# Patient Record
Sex: Male | Born: 1935 | Race: Black or African American | Hispanic: No | Marital: Married
Health system: Southern US, Community
[De-identification: ages and names within clinical notes are randomized; demographics above are authoritative.]

## PROBLEM LIST (undated history)

## (undated) DIAGNOSIS — J90 Pleural effusion, not elsewhere classified: Secondary | ICD-10-CM

## (undated) DIAGNOSIS — J9621 Acute and chronic respiratory failure with hypoxia: Secondary | ICD-10-CM

## (undated) DIAGNOSIS — J156 Pneumonia due to other aerobic Gram-negative bacteria: Secondary | ICD-10-CM

## (undated) DIAGNOSIS — J15212 Pneumonia due to Methicillin resistant Staphylococcus aureus: Secondary | ICD-10-CM

## (undated) DIAGNOSIS — I482 Chronic atrial fibrillation, unspecified: Secondary | ICD-10-CM

## (undated) DIAGNOSIS — I469 Cardiac arrest, cause unspecified: Secondary | ICD-10-CM

## (undated) DIAGNOSIS — D649 Anemia, unspecified: Secondary | ICD-10-CM

## (undated) HISTORY — PX: LARYNGECTOMY: SUR815

## (undated) HISTORY — PX: TRACHEOSTOMY: SUR1362

---

## 2018-04-06 ENCOUNTER — Inpatient Hospital Stay
Admission: AD | Admit: 2018-04-06 | Discharge: 2018-04-29 | Disposition: A | Discharge status: Skilled Nursing Facility | Payer: Medicare HMO | Source: Ambulatory Visit | Attending: Internal Medicine | Admitting: Internal Medicine

## 2018-04-06 DIAGNOSIS — J9621 Acute and chronic respiratory failure with hypoxia: Secondary | ICD-10-CM | POA: Diagnosis present

## 2018-04-06 DIAGNOSIS — I482 Chronic atrial fibrillation, unspecified: Secondary | ICD-10-CM | POA: Diagnosis present

## 2018-04-06 DIAGNOSIS — J15212 Pneumonia due to Methicillin resistant Staphylococcus aureus: Secondary | ICD-10-CM | POA: Diagnosis present

## 2018-04-06 DIAGNOSIS — J156 Pneumonia due to other aerobic Gram-negative bacteria: Secondary | ICD-10-CM | POA: Diagnosis present

## 2018-04-06 DIAGNOSIS — J90 Pleural effusion, not elsewhere classified: Secondary | ICD-10-CM | POA: Diagnosis present

## 2018-04-06 DIAGNOSIS — J9690 Respiratory failure, unspecified, unspecified whether with hypoxia or hypercapnia: Secondary | ICD-10-CM

## 2018-04-06 DIAGNOSIS — Z9889 Other specified postprocedural states: Secondary | ICD-10-CM

## 2018-04-06 DIAGNOSIS — J1569 Pneumonia due to other gram-negative bacteria: Secondary | ICD-10-CM | POA: Diagnosis present

## 2018-04-06 DIAGNOSIS — J189 Pneumonia, unspecified organism: Secondary | ICD-10-CM

## 2018-04-06 HISTORY — DX: Acute and chronic respiratory failure with hypoxia: J96.21

## 2018-04-06 HISTORY — DX: Cardiac arrest, cause unspecified: I46.9

## 2018-04-06 HISTORY — DX: Pneumonia due to methicillin resistant Staphylococcus aureus: J15.212

## 2018-04-06 HISTORY — DX: Anemia, unspecified: D64.9

## 2018-04-06 HISTORY — DX: Pleural effusion, not elsewhere classified: J90

## 2018-04-06 HISTORY — DX: Pneumonia due to other gram-negative bacteria: J15.6

## 2018-04-06 HISTORY — DX: Chronic atrial fibrillation, unspecified: I48.20

## 2018-04-06 LAB — COMPREHENSIVE METABOLIC PANEL
ALK PHOS: 216 U/L — AB (ref 38–126)
ALT: 33 U/L (ref 17–63)
AST: 36 U/L (ref 15–41)
Albumin: 2.4 g/dL — ABNORMAL LOW (ref 3.5–5.0)
Anion gap: 7 (ref 5–15)
BILIRUBIN TOTAL: 0.8 mg/dL (ref 0.3–1.2)
BUN: 41 mg/dL — AB (ref 6–20)
CALCIUM: 8.5 mg/dL — AB (ref 8.9–10.3)
CO2: 31 mmol/L (ref 22–32)
Chloride: 103 mmol/L (ref 101–111)
Creatinine, Ser: 0.85 mg/dL (ref 0.61–1.24)
GFR calc Af Amer: >60 mL/min (ref >60)
Glucose, Bld: 115 mg/dL — ABNORMAL HIGH (ref 65–99)
POTASSIUM: 4.4 mmol/L (ref 3.5–5.1)
Sodium: 141 mmol/L (ref 135–145)
TOTAL PROTEIN: 6.1 g/dL — AB (ref 6.5–8.1)

## 2018-04-06 LAB — CBC WITH DIFFERENTIAL/PLATELET
BAND NEUTROPHILS: 0 %
BLASTS: 0 %
Basophils Absolute: 0 10*3/uL (ref 0.0–0.1)
Basophils Relative: 0 %
EOS ABS: 0 10*3/uL (ref 0.0–0.7)
Eosinophils Relative: 0 %
HEMATOCRIT: 28.2 % — AB (ref 39.0–52.0)
Hemoglobin: 8.4 g/dL — ABNORMAL LOW (ref 13.0–17.0)
Lymphocytes Relative: 7 %
Lymphs Abs: 0.4 10*3/uL — ABNORMAL LOW (ref 0.7–4.0)
MCH: 26.3 pg (ref 26.0–34.0)
MCHC: 29.8 g/dL — ABNORMAL LOW (ref 30.0–36.0)
MCV: 88.1 fL (ref 78.0–100.0)
METAMYELOCYTES PCT: 0 %
MYELOCYTES: 0 %
Monocytes Absolute: 0.5 10*3/uL (ref 0.1–1.0)
Monocytes Relative: 8 %
NEUTROS ABS: 5.4 10*3/uL (ref 1.7–7.7)
Neutrophils Relative %: 85 %
Other: 0 %
PROMYELOCYTES RELATIVE: 0 %
Platelets: 383 10*3/uL (ref 150–400)
RBC: 3.2 MIL/uL — AB (ref 4.22–5.81)
RDW: 21.1 % — ABNORMAL HIGH (ref 11.5–15.5)
WBC: 6.3 10*3/uL (ref 4.0–10.5)
nRBC: 0 /100 WBC

## 2018-04-06 LAB — PROTIME-INR
INR: 1.12
PROTHROMBIN TIME: 14.3 s (ref 11.4–15.2)

## 2018-04-06 LAB — VANCOMYCIN, TROUGH: Vancomycin Tr: 24 ug/mL (ref 15–20)

## 2018-04-07 ENCOUNTER — Encounter: Payer: Self-pay | Admitting: Internal Medicine

## 2018-04-07 ENCOUNTER — Other Ambulatory Visit (HOSPITAL_COMMUNITY): Payer: Medicare HMO

## 2018-04-07 DIAGNOSIS — J15212 Pneumonia due to Methicillin resistant Staphylococcus aureus: Secondary | ICD-10-CM | POA: Diagnosis present

## 2018-04-07 DIAGNOSIS — J156 Pneumonia due to other aerobic Gram-negative bacteria: Secondary | ICD-10-CM | POA: Diagnosis not present

## 2018-04-07 DIAGNOSIS — J90 Pleural effusion, not elsewhere classified: Secondary | ICD-10-CM | POA: Diagnosis present

## 2018-04-07 DIAGNOSIS — Z9889 Other specified postprocedural states: Secondary | ICD-10-CM | POA: Diagnosis not present

## 2018-04-07 DIAGNOSIS — I469 Cardiac arrest, cause unspecified: Secondary | ICD-10-CM | POA: Diagnosis not present

## 2018-04-07 DIAGNOSIS — J9621 Acute and chronic respiratory failure with hypoxia: Secondary | ICD-10-CM | POA: Diagnosis present

## 2018-04-07 DIAGNOSIS — I482 Chronic atrial fibrillation, unspecified: Secondary | ICD-10-CM | POA: Diagnosis present

## 2018-04-07 DIAGNOSIS — J1569 Pneumonia due to other gram-negative bacteria: Secondary | ICD-10-CM | POA: Diagnosis present

## 2018-04-07 LAB — CBC WITH DIFFERENTIAL/PLATELET
BASOS ABS: 0 10*3/uL (ref 0.0–0.1)
BASOS PCT: 0 %
Eosinophils Absolute: 0 10*3/uL (ref 0.0–0.7)
Eosinophils Relative: 0 %
HCT: 27.4 % — ABNORMAL LOW (ref 39.0–52.0)
HEMOGLOBIN: 8.2 g/dL — AB (ref 13.0–17.0)
LYMPHS ABS: 0.4 10*3/uL — AB (ref 0.7–4.0)
Lymphocytes Relative: 6 %
MCH: 26.5 pg (ref 26.0–34.0)
MCHC: 29.9 g/dL — AB (ref 30.0–36.0)
MCV: 88.7 fL (ref 78.0–100.0)
MONOS PCT: 5 %
Monocytes Absolute: 0.3 10*3/uL (ref 0.1–1.0)
NEUTROS ABS: 5.7 10*3/uL (ref 1.7–7.7)
Neutrophils Relative %: 89 %
Platelets: 363 10*3/uL (ref 150–400)
RBC: 3.09 MIL/uL — ABNORMAL LOW (ref 4.22–5.81)
RDW: 21.2 % — ABNORMAL HIGH (ref 11.5–15.5)
WBC: 6.4 10*3/uL (ref 4.0–10.5)

## 2018-04-07 LAB — COMPREHENSIVE METABOLIC PANEL
ALK PHOS: 206 U/L — AB (ref 38–126)
ALT: 32 U/L (ref 17–63)
AST: 30 U/L (ref 15–41)
Albumin: 2.4 g/dL — ABNORMAL LOW (ref 3.5–5.0)
Anion gap: 7 (ref 5–15)
BUN: 38 mg/dL — AB (ref 6–20)
CALCIUM: 8.5 mg/dL — AB (ref 8.9–10.3)
CO2: 33 mmol/L — AB (ref 22–32)
Chloride: 102 mmol/L (ref 101–111)
Creatinine, Ser: 0.9 mg/dL (ref 0.61–1.24)
GFR calc Af Amer: >60 mL/min (ref >60)
GFR calc non Af Amer: >60 mL/min (ref >60)
GLUCOSE: 111 mg/dL — AB (ref 65–99)
Potassium: 4.8 mmol/L (ref 3.5–5.1)
Sodium: 142 mmol/L (ref 135–145)
TOTAL PROTEIN: 6.2 g/dL — AB (ref 6.5–8.1)
Total Bilirubin: 0.8 mg/dL (ref 0.3–1.2)

## 2018-04-07 LAB — PROTIME-INR
INR: 1.17
PROTHROMBIN TIME: 14.8 s (ref 11.4–15.2)

## 2018-04-07 MED ORDER — SODIUM CHLORIDE 0.9 % IV SOLN
10.00 | INTRAVENOUS | Status: DC
Start: ? — End: 2018-04-07

## 2018-04-07 MED ORDER — FUROSEMIDE 10 MG/ML IJ SOLN
40.00 | INTRAMUSCULAR | Status: DC
Start: 2018-04-06 — End: 2018-04-07

## 2018-04-07 MED ORDER — GENERIC EXTERNAL MEDICATION
1.75 | Status: DC
Start: 2018-04-06 — End: 2018-04-07

## 2018-04-07 MED ORDER — MAGNESIUM HYDROXIDE 400 MG/5ML PO SUSP
30.00 | ORAL | Status: DC
Start: ? — End: 2018-04-07

## 2018-04-07 MED ORDER — GENERIC EXTERNAL MEDICATION
Status: DC
Start: ? — End: 2018-04-07

## 2018-04-07 MED ORDER — ACETAMINOPHEN 325 MG PO TABS
650.00 | ORAL_TABLET | ORAL | Status: DC
Start: ? — End: 2018-04-07

## 2018-04-07 MED ORDER — FENTANYL CITRATE (PF) 100 MCG/2ML IJ SOLN
25.00 | INTRAMUSCULAR | Status: DC
Start: ? — End: 2018-04-07

## 2018-04-07 MED ORDER — FIRST-LANSOPRAZOLE 3 MG/ML PO SUSP
30.00 | ORAL | Status: DC
Start: 2018-04-07 — End: 2018-04-07

## 2018-04-07 MED ORDER — GENERIC EXTERNAL MEDICATION
1.00 | Status: DC
Start: 2018-04-06 — End: 2018-04-07

## 2018-04-07 MED ORDER — POLYVINYL ALCOHOL 1.4 % OP SOLN
1.00 | OPHTHALMIC | Status: DC
Start: ? — End: 2018-04-07

## 2018-04-07 MED ORDER — ALBUTEROL SULFATE (2.5 MG/3ML) 0.083% IN NEBU
2.50 | INHALATION_SOLUTION | RESPIRATORY_TRACT | Status: DC
Start: ? — End: 2018-04-07

## 2018-04-07 MED ORDER — GENERIC EXTERNAL MEDICATION
5.00 | Status: DC
Start: 2018-04-07 — End: 2018-04-07

## 2018-04-07 MED ORDER — ALUM & MAG HYDROXIDE-SIMETH 200-200-20 MG/5ML PO SUSP
30.00 | ORAL | Status: DC
Start: ? — End: 2018-04-07

## 2018-04-07 MED ORDER — METHYLPREDNISOLONE SODIUM SUCC 40 MG IJ SOLR
20.00 | INTRAMUSCULAR | Status: DC
Start: 2018-04-06 — End: 2018-04-07

## 2018-04-07 MED ORDER — SENNA-DOCUSATE SODIUM 8.6-50 MG PO TABS
1.00 | ORAL_TABLET | ORAL | Status: DC
Start: ? — End: 2018-04-07

## 2018-04-07 MED ORDER — HEPARIN SODIUM (PORCINE) 5000 UNIT/ML IJ SOLN
5000.00 | INTRAMUSCULAR | Status: DC
Start: 2018-04-06 — End: 2018-04-07

## 2018-04-07 MED ORDER — AMLODIPINE BESYLATE 10 MG PO TABS
10.00 | ORAL_TABLET | ORAL | Status: DC
Start: 2018-04-07 — End: 2018-04-07

## 2018-04-07 MED ORDER — LISINOPRIL 10 MG PO TABS
10.00 | ORAL_TABLET | ORAL | Status: DC
Start: 2018-04-07 — End: 2018-04-07

## 2018-04-07 MED ORDER — ONDANSETRON HCL 4 MG PO TABS
4.00 | ORAL_TABLET | ORAL | Status: DC
Start: ? — End: 2018-04-07

## 2018-04-07 MED ORDER — LORAZEPAM 2 MG/ML IJ SOLN
.50 | INTRAMUSCULAR | Status: DC
Start: ? — End: 2018-04-07

## 2018-04-07 NOTE — Consult Note (Signed)
Pulmonary Critical Care Medicine [facility] [place]  PULMONARY SERVICE  Date of Service: 04/07/2018  PULMONARY CONSULT   Chris [name]  VWU:[id number]  DOB: 02/07/1929   DOA: 04/06/2018  Referring Physician: Carron Curie, MD  HPI: Chris [name] is a 84 y.o. male seen for follow up of Acute on Chronic Respiratory Failure.  Patient has multiple medical problems was admitted with a history of laryngeal cancer status post laryngectomy and a chronic tracheostomy.  Patient had recurrent GI bleeds chronic diastolic heart failure pulmonary hypertension.  Presented to the ED with increased secretions from his trach and also swelling around the trach.  Patient was diagnosed at the time with congestive heart failure and pneumonia.  Patient was started on antibiotics and also was started on diuretics.  Apparently had a CODE BLUE was found to have V. fib arrest and was defibrillated x2.  Patient underwent resuscitation for about 10 minutes.  He was admitted to the ICU was started on hypothermia protocol.  Eventually after stabilization the patient was placed on T collar trials and also had a palliative care consultation.  Complications include on day 9 of MRSA pneumonia and gram-negative pneumonia.  Patient was treated with multiple rounds of antibiotics.  For his heart failure patient was diuresed aggressively.  And for his pneumonia he was started on antibiotics.  Review of Systems:  ROS performed and is unremarkable other than noted above.  Past Medical History:  Diagnosis Date  . Acute on chronic respiratory failure with hypoxia (HCC)   . Cardiac arrest (HCC)   . Chronic anemia   . Chronic atrial fibrillation (HCC)   . Gram-negative pneumonia (HCC)   . MRSA pneumonia (HCC)   . Pleural effusion     Past Surgical History:  Procedure Laterality Date  . LARYNGECTOMY    . TRACHEOSTOMY      Social History:    has an unknown smoking status. He has never used smokeless tobacco. He reports  that he drank alcohol. He reports that he has current or past drug history.  Family History: Non-Contributory to the present illness  Allergies not on file  Medications: Reviewed on Rounds  Physical Exam:  Vitals: Temperature 97.6 pulse 54 respiratory rate 17 blood pressure 148/68 saturations 95%  Ventilator Settings currently on T collar FiO2 40%  . General: Comfortable at this time . Eyes: Grossly normal lids, irises & conjunctiva . ENT: grossly tongue is normal . Neck: no obvious mass . Cardiovascular: S1-S2 normal no gallop or rub . Respiratory: No rhonchi expansion equal . Abdomen: Obese and soft . Skin: no rash seen on limited exam . Musculoskeletal: not rigid . Psychiatric:unable to assess . Neurologic: no seizure no involuntary movements         Labs on Admission:  Basic Metabolic Panel: Recent Labs  Lab 04/06/18 1708 04/07/18 0516  NA 141 142  K 4.4 4.8  CL 103 102  CO2 31 33*  GLUCOSE 115* 111*  BUN 41* 38*  CREATININE 0.85 0.90  CALCIUM 8.5* 8.5*    Liver Function Tests: Recent Labs  Lab 04/06/18 1708 04/07/18 0516  AST 36 30  ALT 33 32  ALKPHOS 216* 206*  BILITOT 0.8 0.8  PROT 6.1* 6.2*  ALBUMIN 2.4* 2.4*   No results for input(s): LIPASE, AMYLASE in the last 168 hours. No results for input(s): AMMONIA in the last 168 hours.  CBC: Recent Labs  Lab 04/06/18 1708 04/07/18 0516  WBC 6.3 6.4  NEUTROABS 5.4 5.7  HGB 8.4* 8.2*  HCT 28.2* 27.4*  MCV 88.1 88.7  PLT 383 363    Cardiac Enzymes: No results for input(s): CKTOTAL, CKMB, CKMBINDEX, TROPONINI in the last 168 hours.  BNP (last 3 results) No results for input(s): BNP in the last 8760 hours.  ProBNP (last 3 results) No results for input(s): PROBNP in the last 8760 hours.   Radiological Exams on Admission: Dg Chest Port 1 View  Result Date: 04/07/2018 CLINICAL DATA:  Respiratory failure, pneumonia. EXAM: PORTABLE CHEST 1 VIEW COMPARISON:  None. FINDINGS: Tracheostomy tube  is in grossly good position. Complete opacification of the left hemithorax is noted most consistent with pleural effusion, pneumonia, atelectasis or combination thereof. Mild right basilar atelectasis or edema is noted with associated pleural effusion. Bony thorax is unremarkable. IMPRESSION: Complete opacification of left hemithorax is noted consistent with pleural effusion, pneumonia, atelectasis or combination thereof. Mild right basilar atelectasis or edema is noted with associated pleural effusion. Electronically Signed   By: Lupita Raider, M.D.   On: 04/07/2018 09:36    Assessment/Plan Active Problems:   Acute on chronic respiratory failure with hypoxia (HCC)   MRSA pneumonia (HCC)   Gram-negative pneumonia (HCC)   Pleural effusion   Chronic atrial fibrillation (HCC)   1. Acute on chronic respiratory failure with hypoxia.  Patient right now is on T collar this is his baseline.  He is adamant T collar for review years her goal should be to wean the FiO2 down as tolerated. 2. MRSA pneumonia treated with antibiotics we will continue with supportive care. 3. Gram-negative pneumonia also treated with 4. Chronic atrial fibrillation rate is controlled we will continue to follow. 5. Pleural effusion follow the x-rays as necessary. 6. Cardiac arrest right now is stable  I have personally seen and evaluated the patient, evaluated laboratory and imaging results, formulated the assessment and plan and placed orders. The Patient requires high complexity decision making for assessment and support.  Case was discussed on Rounds with the Respiratory Therapy Staff Time Spent  Yevonne Pax, MD [facility] Pulmonary Critical Care Medicine Sleep Medicine

## 2018-04-08 DIAGNOSIS — J15212 Pneumonia due to Methicillin resistant Staphylococcus aureus: Secondary | ICD-10-CM | POA: Diagnosis not present

## 2018-04-08 DIAGNOSIS — I482 Chronic atrial fibrillation: Secondary | ICD-10-CM | POA: Diagnosis not present

## 2018-04-08 DIAGNOSIS — J9621 Acute and chronic respiratory failure with hypoxia: Secondary | ICD-10-CM | POA: Diagnosis not present

## 2018-04-08 DIAGNOSIS — J156 Pneumonia due to other aerobic Gram-negative bacteria: Secondary | ICD-10-CM | POA: Diagnosis not present

## 2018-04-08 NOTE — Progress Notes (Signed)
Pulmonary Critical Care Medicine [facility] [place]   PULMONARY SERVICE  PROGRESS NOTE  Date of Service: 04/08/2018  Chris [name]  [id number]:[id number]  DOB: [birth year]   DOA: 04/06/2018  Referring Physician: Carron Curie, MD  HPI: Chris [name] is a 84 y.o. male seen for follow up of Acute on Chronic Respiratory Failure.  Patient is on T collar 28% FiO2 base and basically is back at baseline with a chronic tracheostomy in place.  Medications: Reviewed on Rounds  Physical Exam:  Vitals: Temperature 96.5 pulse 57 respiratory rate 15 blood pressure 176/77 saturations 93%  Ventilator Settings currently is on T collar will be continued  . General: Comfortable at this time . Eyes: Grossly normal lids, irises & conjunctiva . ENT: grossly tongue is normal . Neck: no obvious mass . Cardiovascular: S1 S2 normal no gallop . Respiratory: No rhonchi expansion is equal . Abdomen: soft . Skin: no rash seen on limited exam . Musculoskeletal: not rigid . Psychiatric:unable to assess . Neurologic: no seizure no involuntary movements         Labs on Admission:  Basic Metabolic Panel: Recent Labs  Lab 04/06/18 1708 04/07/18 0516  NA 141 142  K 4.4 4.8  CL 103 102  CO2 31 33*  GLUCOSE 115* 111*  BUN 41* 38*  CREATININE 0.85 0.90  CALCIUM 8.5* 8.5*    Liver Function Tests: Recent Labs  Lab 04/06/18 1708 04/07/18 0516  AST 36 30  ALT 33 32  ALKPHOS 216* 206*  BILITOT 0.8 0.8  PROT 6.1* 6.2*  ALBUMIN 2.4* 2.4*   No results for input(s): LIPASE, AMYLASE in the last 168 hours. No results for input(s): AMMONIA in the last 168 hours.  CBC: Recent Labs  Lab 04/06/18 1708 04/07/18 0516  WBC 6.3 6.4  NEUTROABS 5.4 5.7  HGB 8.4* 8.2*  HCT 28.2* 27.4*  MCV 88.1 88.7  PLT 383 363    Cardiac Enzymes: No results for input(s): CKTOTAL, CKMB, CKMBINDEX, TROPONINI in the last 168 hours.  BNP (last 3 results) No results for input(s): BNP in the last 8760  hours.  ProBNP (last 3 results) No results for input(s): PROBNP in the last 8760 hours.  Radiological Exams on Admission: Dg Chest Port 1 View  Result Date: 04/07/2018 CLINICAL DATA:  Respiratory failure, pneumonia. EXAM: PORTABLE CHEST 1 VIEW COMPARISON:  None. FINDINGS: Tracheostomy tube is in grossly good position. Complete opacification of the left hemithorax is noted most consistent with pleural effusion, pneumonia, atelectasis or combination thereof. Mild right basilar atelectasis or edema is noted with associated pleural effusion. Bony thorax is unremarkable. IMPRESSION: Complete opacification of left hemithorax is noted consistent with pleural effusion, pneumonia, atelectasis or combination thereof. Mild right basilar atelectasis or edema is noted with associated pleural effusion. Electronically Signed   By: Lupita Raider, M.D.   On: 04/07/2018 09:36    Assessment/Plan Principal Problem:   Acute on chronic respiratory failure with hypoxia (HCC) Active Problems:   MRSA pneumonia (HCC)   Gram-negative pneumonia (HCC)   Pleural effusion   Chronic atrial fibrillation (HCC)   1. Acute on chronic respiratory failure with hypoxia patient right now is on T collar is actually doing okay has been on 28% oxygen.  The chest x-ray however shows almost complete opacification of the left hemithorax which may represent atelectasis versus fluid.  I suggest we get a CT of the chest. 2. Pleural effusion we will do a CT of the chest to really observe the  amount of fluid that is present on the left side.  He may need a bronchoscopy versus thoracentesis. 3. Gram-negative pneumonia has been treated with antibiotics we will continue to follow 4. Chronic atrial fibrillation rate is controlled 5. MRSA pneumonia has been treated with antibiotics.   I have personally seen and evaluated the patient, evaluated laboratory and imaging results, formulated the assessment and plan and placed orders. The Patient  requires high complexity decision making for assessment and support.  Case was discussed on Rounds with the Respiratory Therapy Staff time spent 35 minutes  Yevonne Pax, MD [facility] Pulmonary Critical Care Medicine Sleep Medicine

## 2018-04-09 ENCOUNTER — Institutional Professional Consult (permissible substitution) (HOSPITAL_COMMUNITY): Payer: Medicare HMO

## 2018-04-09 ENCOUNTER — Encounter: Payer: Self-pay | Admitting: *Deleted

## 2018-04-09 LAB — VANCOMYCIN, TROUGH: Vancomycin Tr: 28 ug/mL (ref 15–20)

## 2018-04-09 MED ORDER — IOHEXOL 300 MG/ML  SOLN
100.0000 mL | Freq: Once | INTRAMUSCULAR | Status: AC | PRN
  Administered 2018-04-09: 75 mL via INTRAVENOUS

## 2018-04-09 MED ORDER — GENERIC EXTERNAL MEDICATION
Status: DC
Start: ? — End: 2018-04-09

## 2018-04-10 LAB — VANCOMYCIN, TROUGH: VANCOMYCIN TR: 19 ug/mL (ref 15–20)

## 2018-04-11 DIAGNOSIS — J15212 Pneumonia due to Methicillin resistant Staphylococcus aureus: Secondary | ICD-10-CM | POA: Diagnosis not present

## 2018-04-11 DIAGNOSIS — J156 Pneumonia due to other aerobic Gram-negative bacteria: Secondary | ICD-10-CM | POA: Diagnosis not present

## 2018-04-11 DIAGNOSIS — J9621 Acute and chronic respiratory failure with hypoxia: Secondary | ICD-10-CM | POA: Diagnosis not present

## 2018-04-11 DIAGNOSIS — I482 Chronic atrial fibrillation: Secondary | ICD-10-CM | POA: Diagnosis not present

## 2018-04-11 LAB — CBC
HCT: 26.3 % — ABNORMAL LOW (ref 39.0–52.0)
HEMOGLOBIN: 7.9 g/dL — AB (ref 13.0–17.0)
MCH: 26.8 pg (ref 26.0–34.0)
MCHC: 30 g/dL (ref 30.0–36.0)
MCV: 89.2 fL (ref 78.0–100.0)
Platelets: 260 10*3/uL (ref 150–400)
RBC: 2.95 MIL/uL — AB (ref 4.22–5.81)
RDW: 20.8 % — AB (ref 11.5–15.5)
WBC: 6 10*3/uL (ref 4.0–10.5)

## 2018-04-11 LAB — BASIC METABOLIC PANEL
Anion gap: 6 (ref 5–15)
BUN: 30 mg/dL — ABNORMAL HIGH (ref 6–20)
CHLORIDE: 103 mmol/L (ref 101–111)
CO2: 33 mmol/L — ABNORMAL HIGH (ref 22–32)
CREATININE: 0.74 mg/dL (ref 0.61–1.24)
Calcium: 8.6 mg/dL — ABNORMAL LOW (ref 8.9–10.3)
GFR calc Af Amer: >60 mL/min (ref >60)
GFR calc non Af Amer: >60 mL/min (ref >60)
GLUCOSE: 87 mg/dL (ref 65–99)
Potassium: 3.7 mmol/L (ref 3.5–5.1)
Sodium: 142 mmol/L (ref 135–145)

## 2018-04-11 NOTE — Progress Notes (Signed)
Pulmonary Critical Care Medicine [facility] [place]   PULMONARY SERVICE  PROGRESS NOTE  Date of Service: 04/11/2018  Chris [name]  ZOX:[id number]  DOB: 22-Aug-1929   DOA: 04/06/2018  Referring Physician: Carron Curie, MD  HPI: Chris [name] is a 84 y.o. male seen for follow up of Acute on Chronic Respiratory Failure.  Doing well at baseline on her/T collar patient is on 20% oxygen good saturations are noted.  Medications: Reviewed on Rounds  Physical Exam:  Vitals: Temperature 98.4 pulse 82 respiratory rate 23 blood pressure 168/80 saturations 98%  Ventilator Settings off the ventilator on T collar FiO2 28%  . General: Comfortable at this time . Eyes: Grossly normal lids, irises & conjunctiva . ENT: grossly tongue is normal . Neck: no obvious mass . Cardiovascular: S1 S2 normal no gallop . Respiratory: No rhonchi noted . Abdomen: soft . Skin: no rash seen on limited exam . Musculoskeletal: not rigid . Psychiatric:unable to assess . Neurologic: no seizure no involuntary movements         Labs on Admission:  Basic Metabolic Panel: Recent Labs  Lab 04/06/18 1708 04/07/18 0516 04/11/18 0439  NA 141 142 142  K 4.4 4.8 3.7  CL 103 102 103  CO2 31 33* 33*  GLUCOSE 115* 111* 87  BUN 41* 38* 30*  CREATININE 0.85 0.90 0.74  CALCIUM 8.5* 8.5* 8.6*    Liver Function Tests: Recent Labs  Lab 04/06/18 1708 04/07/18 0516  AST 36 30  ALT 33 32  ALKPHOS 216* 206*  BILITOT 0.8 0.8  PROT 6.1* 6.2*  ALBUMIN 2.4* 2.4*   No results for input(s): LIPASE, AMYLASE in the last 168 hours. No results for input(s): AMMONIA in the last 168 hours.  CBC: Recent Labs  Lab 04/06/18 1708 04/07/18 0516 04/11/18 0439  WBC 6.3 6.4 6.0  NEUTROABS 5.4 5.7  --   HGB 8.4* 8.2* 7.9*  HCT 28.2* 27.4* 26.3*  MCV 88.1 88.7 89.2  PLT 383 363 260    Cardiac Enzymes: No results for input(s): CKTOTAL, CKMB, CKMBINDEX, TROPONINI in the last 168 hours.  BNP (last 3  results) No results for input(s): BNP in the last 8760 hours.  ProBNP (last 3 results) No results for input(s): PROBNP in the last 8760 hours.  Radiological Exams on Admission: Ct Chest W Contrast  Result Date: 04/09/2018 CLINICAL DATA:  Respiratory failure, shortness of breath EXAM: CT CHEST WITH CONTRAST TECHNIQUE: Multidetector CT imaging of the chest was performed during intravenous contrast administration. CONTRAST:  75mL OMNIPAQUE IOHEXOL 300 MG/ML  SOLN COMPARISON:  Radiography 04/07/2018 FINDINGS: Cardiovascular: Four-chamber cardiac enlargement. Small pericardial effusion. Dilated central pulmonary arteries. Coronary calcifications. Aortic leaflet calcifications. Dilated aortic root up to 5.1 cm diameter. Scattered calcified atheromatous plaque through the thoracic aorta. Mediastinum/Nodes: Tracheostomy projects in expected location. No hilar or mediastinal adenopathy. Lungs/Pleura: Moderate bilateral pleural effusions. Associated dependent atelectasis in the right lower lobe. More extensive consolidation throughout much of the left lung with relative sparing of the apex. Upper Abdomen: No acute findings Musculoskeletal: Anterior vertebral endplate spurring at multiple levels in the mid and lower thoracic spine. Healing sternal fracture. No acute fracture or worrisome bone lesion. IMPRESSION: 1. Extensive airspace consolidation throughout the left lung. 2. Moderate bilateral pleural effusions. 3. Dilated central pulmonary arteries suggesting pulmonary arterial hypertension. 4. Coronary and aortic leaflet calcifications. Correlate with any echocardiographic evidence of aortic stenosis. 5. 4.1 cm ascending thoracic aortic aneurysm. Recommend semi-annual imaging followup by CTA or MRA and referral  to cardiothoracic surgery if not already obtained. This recommendation follows 2010 ACCF/AHA/AATS/ACR/ASA/SCA/SCAI/SIR/STS/SVM Guidelines for the Diagnosis and Management of Patients With Thoracic Aortic  Disease. Circulation. 2010; 121: J811-B147 Electronically Signed   By: Corlis Leak M.D.   On: 04/09/2018 13:35    Assessment/Plan Principal Problem:   Acute on chronic respiratory failure with hypoxia (HCC) Active Problems:   MRSA pneumonia (HCC)   Gram-negative pneumonia (HCC)   Pleural effusion   Chronic atrial fibrillation (HCC)   1. Acute on chronic respiratory failure with hypoxia we will continue with T collar trials continue pulmonary toilet supportive care. 2. MRSA pneumonia treated continue to monitor.  Last CT scan still showed extensive airway disease.  We will continue to monitor 3. Gram-negative pneumonia treated with antibiotics continue with supportive care 4. Pleural effusion moderate-sized on the CT 5. Chronic atrial fibrillation rate is controlled at this time.   I have personally seen and evaluated the patient, evaluated laboratory and imaging results, formulated the assessment and plan and placed orders. The Patient requires high complexity decision making for assessment and support.  Case was discussed on Rounds with the Respiratory Therapy Staff  Yevonne Pax, MD [facility] Pulmonary Critical Care Medicine Sleep Medicine

## 2018-04-12 DIAGNOSIS — J9621 Acute and chronic respiratory failure with hypoxia: Secondary | ICD-10-CM | POA: Diagnosis not present

## 2018-04-12 DIAGNOSIS — J156 Pneumonia due to other aerobic Gram-negative bacteria: Secondary | ICD-10-CM | POA: Diagnosis not present

## 2018-04-12 DIAGNOSIS — J15212 Pneumonia due to Methicillin resistant Staphylococcus aureus: Secondary | ICD-10-CM | POA: Diagnosis not present

## 2018-04-12 DIAGNOSIS — I482 Chronic atrial fibrillation: Secondary | ICD-10-CM | POA: Diagnosis not present

## 2018-04-12 NOTE — Progress Notes (Signed)
Pulmonary Critical Care Medicine [facility] [place]   PULMONARY SERVICE  PROGRESS NOTE  Date of Service: 04/12/2018  TRESON [name]  WUJ:[id number]  DOB: 1929-03-05   DOA: 04/06/2018  Referring Physician: Carron Curie, MD  HPI: Chris [name] is a 84 y.o. male seen for follow up of Acute on Chronic Respiratory Failure.  He is on the T collar looks good right now.  Comfortable without distress.  Patient's been on 28% oxygen  Medications: Reviewed on Rounds  Physical Exam:  Vitals: Temperature 97.2 pulse 53 respiratory rate 11 blood pressure 155/69 saturations 98%  Ventilator Settings off the ventilator on T collar at this time.  . General: Comfortable at this time . Eyes: Grossly normal lids, irises & conjunctiva . ENT: grossly tongue is normal . Neck: no obvious mass . Cardiovascular: S1 S2 normal no gallop . Respiratory: No rhonchi or rales expansion is equal . Abdomen: soft . Skin: no rash seen on limited exam . Musculoskeletal: not rigid . Psychiatric:unable to assess . Neurologic: no seizure no involuntary movements         Labs on Admission:  Basic Metabolic Panel: Recent Labs  Lab 04/06/18 1708 04/07/18 0516 04/11/18 0439  NA 141 142 142  K 4.4 4.8 3.7  CL 103 102 103  CO2 31 33* 33*  GLUCOSE 115* 111* 87  BUN 41* 38* 30*  CREATININE 0.85 0.90 0.74  CALCIUM 8.5* 8.5* 8.6*    Liver Function Tests: Recent Labs  Lab 04/06/18 1708 04/07/18 0516  AST 36 30  ALT 33 32  ALKPHOS 216* 206*  BILITOT 0.8 0.8  PROT 6.1* 6.2*  ALBUMIN 2.4* 2.4*   No results for input(s): LIPASE, AMYLASE in the last 168 hours. No results for input(s): AMMONIA in the last 168 hours.  CBC: Recent Labs  Lab 04/06/18 1708 04/07/18 0516 04/11/18 0439  WBC 6.3 6.4 6.0  NEUTROABS 5.4 5.7  --   HGB 8.4* 8.2* 7.9*  HCT 28.2* 27.4* 26.3*  MCV 88.1 88.7 89.2  PLT 383 363 260    Cardiac Enzymes: No results for input(s): CKTOTAL, CKMB, CKMBINDEX, TROPONINI in the  last 168 hours.  BNP (last 3 results) No results for input(s): BNP in the last 8760 hours.  ProBNP (last 3 results) No results for input(s): PROBNP in the last 8760 hours.  Radiological Exams on Admission: Ct Chest W Contrast  Result Date: 04/09/2018 CLINICAL DATA:  Respiratory failure, shortness of breath EXAM: CT CHEST WITH CONTRAST TECHNIQUE: Multidetector CT imaging of the chest was performed during intravenous contrast administration. CONTRAST:  75mL OMNIPAQUE IOHEXOL 300 MG/ML  SOLN COMPARISON:  Radiography 04/07/2018 FINDINGS: Cardiovascular: Four-chamber cardiac enlargement. Small pericardial effusion. Dilated central pulmonary arteries. Coronary calcifications. Aortic leaflet calcifications. Dilated aortic root up to 5.1 cm diameter. Scattered calcified atheromatous plaque through the thoracic aorta. Mediastinum/Nodes: Tracheostomy projects in expected location. No hilar or mediastinal adenopathy. Lungs/Pleura: Moderate bilateral pleural effusions. Associated dependent atelectasis in the right lower lobe. More extensive consolidation throughout much of the left lung with relative sparing of the apex. Upper Abdomen: No acute findings Musculoskeletal: Anterior vertebral endplate spurring at multiple levels in the mid and lower thoracic spine. Healing sternal fracture. No acute fracture or worrisome bone lesion. IMPRESSION: 1. Extensive airspace consolidation throughout the left lung. 2. Moderate bilateral pleural effusions. 3. Dilated central pulmonary arteries suggesting pulmonary arterial hypertension. 4. Coronary and aortic leaflet calcifications. Correlate with any echocardiographic evidence of aortic stenosis. 5. 4.1 cm ascending thoracic aortic aneurysm. Recommend  semi-annual imaging followup by CTA or MRA and referral to cardiothoracic surgery if not already obtained. This recommendation follows 2010 ACCF/AHA/AATS/ACR/ASA/SCA/SCAI/SIR/STS/SVM Guidelines for the Diagnosis and Management of  Patients With Thoracic Aortic Disease. Circulation. 2010; 121: [id number] Electronically Signed   By: Corlis Leak M.D.   On: 04/09/2018 13:35    Assessment/Plan Principal Problem:   Acute on chronic respiratory failure with hypoxia (HCC) Active Problems:   MRSA pneumonia (HCC)   Gram-negative pneumonia (HCC)   Pleural effusion   Chronic atrial fibrillation (HCC)   1. Acute on chronic respiratory failure with hypoxia we will continue with full supportive care on T collar patient is at baseline right now.  We will continue pulmonary toilet supportive care 2. MRSA pneumonia treated with antibiotics we will continue present management 3. Chronic atrial fibrillation rate is controlled 4. Pleural effusion stable follow-up x-ray as needed   I have personally seen and evaluated the patient, evaluated laboratory and imaging results, formulated the assessment and plan and placed orders. The Patient requires high complexity decision making for assessment and support.  Case was discussed on Rounds with the Respiratory Therapy Staff  Yevonne Pax, MD [facility] Pulmonary Critical Care Medicine Sleep Medicine

## 2018-04-12 NOTE — Consult Note (Signed)
Referring Physician:  THURMON Chris [name] is an 84 y.o. male.                       Chief Complaint: Bradycardia  HPI: 84 year old male with PMH of acute on chronic respiratory failure, laryngeal cancer, s/p laryngectomy and chronic tracheostomy, GI bleed, Diastolic heart failure, pulmonary systolic hypertension, s/p V. Fib arrest, pneumonia has atrial fibrillation with high grade AV block at times. He is not on B-blocker therapy or lanoxin. He is on amlodipine and lisinopril for blood pressure control.   Past Medical History:  Diagnosis Date  . Acute on chronic respiratory failure with hypoxia ([place])   . Cardiac arrest ([place])   . Chronic anemia   . Chronic atrial fibrillation ([place])   . Gram-negative pneumonia ([place])   . MRSA pneumonia ([place])   . Pleural effusion       Past Surgical History:  Procedure Laterality Date  . LARYNGECTOMY    . TRACHEOSTOMY      Family History  Family history unknown: Yes   Social History:  has an unknown smoking status. He has never used smokeless tobacco. He reports that he drank alcohol. He reports that he has current or past drug history.  Allergies: Not on File  No medications prior to admission.    Results for orders placed or performed during the hospital encounter of 04/06/18 (from the past 48 hour(s))  CBC     Status: Abnormal   Collection Time: 04/11/18  4:39 AM  Result Value Ref Range   WBC 6.0 4.0 - 10.5 K/uL   RBC 2.95 (L) 4.22 - 5.81 MIL/uL   Hemoglobin 7.9 (L) 13.0 - 17.0 g/dL   HCT 26.3 (L) 39.0 - 52.0 %   MCV 89.2 78.0 - 100.0 fL   MCH 26.8 26.0 - 34.0 pg   MCHC 30.0 30.0 - 36.0 g/dL   RDW 20.8 (H) 11.5 - 15.5 %   Platelets 260 150 - 400 K/uL    Comment: Performed at [facility] [address], [place], [place] [address]  Basic metabolic panel     Status: Abnormal   Collection Time: 04/11/18  4:39 AM  Result Value Ref Range   Sodium 142 135 - 145 mmol/L   Potassium 3.7 3.5 - 5.1 mmol/L   Chloride 103 101 - 111 mmol/L   CO2 33 (H) 22 - 32 mmol/L   Glucose, Bld 87 65 - 99 mg/dL   BUN 30 (H) 6 - 20 mg/dL   Creatinine, Ser 0.74 0.61 - 1.24 mg/dL   Calcium 8.6 (L) 8.9 - 10.3 mg/dL   GFR calc non Af Amer >60 >60 mL/min   GFR calc Af Amer >60 >60 mL/min    Comment: (NOTE) The eGFR has been calculated using the CKD EPI equation. This calculation has not been validated in all clinical situations. eGFR's persistently <60 mL/min signify possible Chronic Kidney Disease.    Anion gap 6 5 - 15    Comment: Performed at [facility] [address] [place], [place] [address]   No results found.  Review Of Systems Constitutional: Positive fever, chills, weight loss. Eyes: No vision change, wears glasses. No discharge or pain. Ears: No hearing loss, No tinnitus. Respiratory: No asthma, positive COPD, pneumonias, shortness of breath. No hemoptysis. Cardiovascular: No chest pain, palpitation, leg edema. Gastrointestinal: No nausea, vomiting, diarrhea, constipation. Positive GI bleed. No hepatitis. Genitourinary: No dysuria, hematuria, kidney stone. No incontinance.  Neurological: No headache, stroke, seizures.  Psychiatry: No psych facility admission for anxiety, depression, suicide. No detox. Skin: No rash. Musculoskeletal: Positive joint pain, no fibromyalgia. No neck pain, back pain. Lymphadenopathy: No lymphadenopathy. Hematology: Positive anemia or easy bruising.   There were no vitals taken for this visit. There is no height or weight on file to calculate BMI. General appearance: alert, cooperative, appears stated age and no distress Head: Normocephalic, atraumatic. Trach collar applied. Eyes: [place] eyes, pale conjunctiva, corneas clear.  Neck: No adenopathy, no carotid bruit, no JVD, supple, symmetrical, trachea midline and thyroid not enlarged. Resp: Clearing to auscultation bilaterally. Cardio: Irregular rate and rhythm, S1, S2 normal, II/VI systolic murmur, no click, rub or gallop GI: Soft,  non-tender; bowel sounds normal; no organomegaly. Extremities: No edema, cyanosis or clubbing. Right BKA. Skin: Warm and dry.  Neurologic: Alert and oriented X 2, normal strength.   Assessment/Plan Atrial fibrillation with high grade AV block, [id number] score of 5 Acute on chronic respiratory failure with hypoxemia, improving Survivor of cardiac arrest Anemia of chronic disease Hypertension Gram negative and MRSA pneumonia S/P laryngectomy S/P tracheostomy  Change amlodipine to hydralazine to improve heart rate Check TSH, thyroid supplement if needed. Not a candidate for anti-coagulation. May need permanent pacemaker if symptomatic  Chris Riddle, MD  04/12/2018, 8:11 PM

## 2018-04-13 ENCOUNTER — Institutional Professional Consult (permissible substitution) (HOSPITAL_COMMUNITY): Payer: Medicare HMO

## 2018-04-13 ENCOUNTER — Other Ambulatory Visit (HOSPITAL_COMMUNITY): Payer: Medicare HMO

## 2018-04-13 DIAGNOSIS — I482 Chronic atrial fibrillation: Secondary | ICD-10-CM | POA: Diagnosis not present

## 2018-04-13 DIAGNOSIS — J156 Pneumonia due to other aerobic Gram-negative bacteria: Secondary | ICD-10-CM | POA: Diagnosis not present

## 2018-04-13 DIAGNOSIS — J9621 Acute and chronic respiratory failure with hypoxia: Secondary | ICD-10-CM | POA: Diagnosis not present

## 2018-04-13 DIAGNOSIS — J15212 Pneumonia due to Methicillin resistant Staphylococcus aureus: Secondary | ICD-10-CM | POA: Diagnosis not present

## 2018-04-13 LAB — CBC
HCT: 29.2 % — ABNORMAL LOW (ref 39.0–52.0)
HEMOGLOBIN: 8.7 g/dL — AB (ref 13.0–17.0)
MCH: 26.4 pg (ref 26.0–34.0)
MCHC: 29.8 g/dL — ABNORMAL LOW (ref 30.0–36.0)
MCV: 88.5 fL (ref 78.0–100.0)
Platelets: 217 10*3/uL (ref 150–400)
RBC: 3.3 MIL/uL — AB (ref 4.22–5.81)
RDW: 21.4 % — ABNORMAL HIGH (ref 11.5–15.5)
WBC: 7.7 10*3/uL (ref 4.0–10.5)

## 2018-04-13 LAB — RENAL FUNCTION PANEL
ALBUMIN: 2.6 g/dL — AB (ref 3.5–5.0)
ANION GAP: 7 (ref 5–15)
BUN: 30 mg/dL — ABNORMAL HIGH (ref 6–20)
CO2: 33 mmol/L — ABNORMAL HIGH (ref 22–32)
Calcium: 8.5 mg/dL — ABNORMAL LOW (ref 8.9–10.3)
Chloride: 101 mmol/L (ref 101–111)
Creatinine, Ser: 0.82 mg/dL (ref 0.61–1.24)
GFR calc non Af Amer: >60 mL/min (ref >60)
Glucose, Bld: 84 mg/dL (ref 65–99)
Phosphorus: 3.3 mg/dL (ref 2.5–4.6)
Potassium: 3.6 mmol/L (ref 3.5–5.1)
SODIUM: 141 mmol/L (ref 135–145)

## 2018-04-13 LAB — TSH: TSH: 5.32 u[IU]/mL — ABNORMAL HIGH (ref 0.350–4.500)

## 2018-04-13 LAB — URIC ACID: Uric Acid, Serum: 4.2 mg/dL — ABNORMAL LOW (ref 4.4–7.6)

## 2018-04-13 LAB — MAGNESIUM: Magnesium: 2 mg/dL (ref 1.7–2.4)

## 2018-04-13 MED ORDER — GENERIC EXTERNAL MEDICATION
Status: DC
Start: ? — End: 2018-04-13

## 2018-04-13 NOTE — Consult Note (Signed)
Ref: Carron Curie, MD   Subjective:  Heart rate improving off amlodipine. On T collar. Monitor-atrial fibrillation.  Objective:  Vital Signs in the last 24 hours: T-97.5, pulse 74, R-18, BP-148/82 saturation 97 %.     Physical Exam: BP Readings from Last 1 Encounters:  No data found for BP     Wt Readings from Last 1 Encounters:  No data found for Wt    Weight change:  There is no height or weight on file to calculate BMI. HEENT: [place]/AT, Eyes-[place], Conjunctiva-Pale, Sclera-Non-icteric Neck: No JVD, No bruit, Trachea midline. Lungs:  Clearing, Bilateral. Cardiac:  Irregular rhythm, normal S1 and S2, no S3. II/VI systolic murmur. Abdomen:  Soft, non-tender. BS present. Extremities:  No edema present. No cyanosis. No clubbing. Right BKA. CNS: AxOx2, Cranial nerves grossly intact, moves all 4 extremities.  Skin: Warm and dry.   Intake/Output from previous day: No intake/output data recorded.    Lab Results: BMET    Component Value Date/Time   NA 141 04/13/2018 0842   NA 142 04/11/2018 0439   NA 142 04/07/2018 0516   K 3.6 04/13/2018 0842   K 3.7 04/11/2018 0439   K 4.8 04/07/2018 0516   CL 101 04/13/2018 0842   CL 103 04/11/2018 0439   CL 102 04/07/2018 0516   CO2 33 (H) 04/13/2018 0842   CO2 33 (H) 04/11/2018 0439   CO2 33 (H) 04/07/2018 0516   GLUCOSE 84 04/13/2018 0842   GLUCOSE 87 04/11/2018 0439   GLUCOSE 111 (H) 04/07/2018 0516   BUN 30 (H) 04/13/2018 0842   BUN 30 (H) 04/11/2018 0439   BUN 38 (H) 04/07/2018 0516   CREATININE 0.82 04/13/2018 0842   CREATININE 0.74 04/11/2018 0439   CREATININE 0.90 04/07/2018 0516   CALCIUM 8.5 (L) 04/13/2018 0842   CALCIUM 8.6 (L) 04/11/2018 0439   CALCIUM 8.5 (L) 04/07/2018 0516   GFRNONAA >60 04/13/2018 0842   GFRNONAA >60 04/11/2018 0439   GFRNONAA >60 04/07/2018 0516   GFRAA >60 04/13/2018 0842   GFRAA >60 04/11/2018 0439   GFRAA >60 04/07/2018 0516   CBC    Component Value Date/Time   WBC 7.7 04/13/2018 0842    RBC 3.30 (L) 04/13/2018 0842   HGB 8.7 (L) 04/13/2018 0842   HCT 29.2 (L) 04/13/2018 0842   PLT 217 04/13/2018 0842   MCV 88.5 04/13/2018 0842   MCH 26.4 04/13/2018 0842   MCHC 29.8 (L) 04/13/2018 0842   RDW 21.4 (H) 04/13/2018 0842   LYMPHSABS 0.4 (L) 04/07/2018 0516   MONOABS 0.3 04/07/2018 0516   EOSABS 0.0 04/07/2018 0516   BASOSABS 0.0 04/07/2018 0516   HEPATIC Function Panel Recent Labs    04/06/18 1708 04/07/18 0516  PROT 6.1* 6.2*   HEMOGLOBIN A1C No components found for: HGA1C,  MPG CARDIAC ENZYMES No results found for: CKTOTAL, CKMB, CKMBINDEX, TROPONINI BNP No results for input(s): PROBNP in the last 8760 hours. TSH Recent Labs    04/13/18 0842  TSH 5.320*   CHOLESTEROL No results for input(s): CHOL in the last 8760 hours.  Scheduled Meds: Continuous Infusions: PRN Meds:.  Assessment/Plan: Atrial fibrillation with controlled ventricular response Acute on chronic respiratory failure Survivor of V. Fib cardiac arrest Hypertension Anemia of chronic disease S/P laryngectomy S/P tracheostomy Gram negative and MRSA pneumonia  Continue medical treatment. Start small dose levothyroxine - 25 mcg. daily.   LOS: 7 days    Orpah Cobb  MD  04/13/2018, 5:35 PM

## 2018-04-13 NOTE — Progress Notes (Signed)
Pulmonary Critical Care Medicine [facility] [place]   PULMONARY SERVICE  PROGRESS NOTE  Date of Service: 04/13/2018  Chris [name]  ZOX:[id number]  DOB: 23-Jun-1929   DOA: 04/06/2018  Referring Physician: Carron Curie, MD  HPI: Chris [name] is a 84 y.o. male seen for follow up of Acute on Chronic Respiratory Failure.  Patient right now is on T collar has been doing fine is at baseline  Medications: Reviewed on Rounds  Physical Exam:  Vitals: Temperature 97.5 pulse 74 respiratory 18 blood pressure 148/82 saturations 97%  Ventilator Settings on T collar trials will continue  . General: Comfortable at this time . Eyes: Grossly normal lids, irises & conjunctiva . ENT: grossly tongue is normal . Neck: no obvious mass . Cardiovascular: S1 S2 normal no gallop . Respiratory: No rhonchi or rales expansion is equal . Abdomen: soft . Skin: no rash seen on limited exam . Musculoskeletal: not rigid . Psychiatric:unable to assess . Neurologic: no seizure no involuntary movements         Labs on Admission:  Basic Metabolic Panel: Recent Labs  Lab 04/06/18 1708 04/07/18 0516 04/11/18 0439 04/13/18 0842  NA 141 142 142 141  K 4.4 4.8 3.7 3.6  CL 103 102 103 101  CO2 31 33* 33* 33*  GLUCOSE 115* 111* 87 84  BUN 41* 38* 30* 30*  CREATININE 0.85 0.90 0.74 0.82  CALCIUM 8.5* 8.5* 8.6* 8.5*  MG  --   --   --  2.0  PHOS  --   --   --  3.3    Liver Function Tests: Recent Labs  Lab 04/06/18 1708 04/07/18 0516 04/13/18 0842  AST 36 30  --   ALT 33 32  --   ALKPHOS 216* 206*  --   BILITOT 0.8 0.8  --   PROT 6.1* 6.2*  --   ALBUMIN 2.4* 2.4* 2.6*   No results for input(s): LIPASE, AMYLASE in the last 168 hours. No results for input(s): AMMONIA in the last 168 hours.  CBC: Recent Labs  Lab 04/06/18 1708 04/07/18 0516 04/11/18 0439 04/13/18 0842  WBC 6.3 6.4 6.0 7.7  NEUTROABS 5.4 5.7  --   --   HGB 8.4* 8.2* 7.9* 8.7*  HCT 28.2* 27.4* 26.3* 29.2*  MCV  88.1 88.7 89.2 88.5  PLT 383 363 260 217    Cardiac Enzymes: No results for input(s): CKTOTAL, CKMB, CKMBINDEX, TROPONINI in the last 168 hours.  BNP (last 3 results) No results for input(s): BNP in the last 8760 hours.  ProBNP (last 3 results) No results for input(s): PROBNP in the last 8760 hours.  Radiological Exams on Admission: Dg Chest Port 1 View  Result Date: 04/13/2018 CLINICAL DATA:  Shortness of breath EXAM: PORTABLE CHEST 1 VIEW COMPARISON:  04/09/2018 FINDINGS: Tracheostomy tube is again identified. Cardiac enlargement is again seen. Stable aortic calcifications are noted. The right lung is predominately well aerated although some basilar consolidation with a small effusion is again seen. Diffuse left-sided consolidation with large effusion is seen. These changes are similar to that noted on prior CT examination. No new focal abnormality is noted. IMPRESSION: Stable appearance of the chest when compare with the prior CT. Bilateral airspace consolidation with effusions is seen worse on the left than the right. Electronically Signed   By: Alcide Clever M.D.   On: 04/13/2018 09:00    Assessment/Plan Principal Problem:   Acute on chronic respiratory failure with hypoxia (HCC) Active Problems:  MRSA pneumonia (HCC)   Gram-negative pneumonia (HCC)   Pleural effusion   Chronic atrial fibrillation (H[facility])   1. Acute on chronic respiratory failure with hypoxia we will continue with T collar as tolerated.  Patient is at baseline with chronic tracheostomy and laryngectomy. 2. MRSA pneumonia treated with antibiotics we will continue to follow 3. Pleural effusion stable follow-up x-rays shows stable ability with slight increase fluid on the left greater than the right. 4. Chronic atrial fibrillation rate is controlled we will continue to monitor   I have personally seen and evaluated the patient, evaluated laboratory and imaging results, formulated the assessment and plan and placed  orders. The Patient requires high complexity decision making for assessment and support.  Case was discussed on Rounds with the Respiratory Therapy Staff  Yevonne Pax, MD [facility] Pulmonary Critical Care Medicine Sleep Medicine

## 2018-04-14 ENCOUNTER — Other Ambulatory Visit (HOSPITAL_COMMUNITY): Payer: Medicare HMO

## 2018-04-14 DIAGNOSIS — J156 Pneumonia due to other aerobic Gram-negative bacteria: Secondary | ICD-10-CM | POA: Diagnosis not present

## 2018-04-14 DIAGNOSIS — I482 Chronic atrial fibrillation: Secondary | ICD-10-CM | POA: Diagnosis not present

## 2018-04-14 DIAGNOSIS — J15212 Pneumonia due to Methicillin resistant Staphylococcus aureus: Secondary | ICD-10-CM | POA: Diagnosis not present

## 2018-04-14 DIAGNOSIS — J9621 Acute and chronic respiratory failure with hypoxia: Secondary | ICD-10-CM | POA: Diagnosis not present

## 2018-04-14 LAB — RENAL FUNCTION PANEL
ALBUMIN: 2.4 g/dL — AB (ref 3.5–5.0)
ANION GAP: 7 (ref 5–15)
BUN: 37 mg/dL — AB (ref 6–20)
CALCIUM: 8.3 mg/dL — AB (ref 8.9–10.3)
CO2: 28 mmol/L (ref 22–32)
Chloride: 104 mmol/L (ref 101–111)
Creatinine, Ser: 0.81 mg/dL (ref 0.61–1.24)
GFR calc Af Amer: >60 mL/min (ref >60)
GLUCOSE: 86 mg/dL (ref 65–99)
PHOSPHORUS: 3.2 mg/dL (ref 2.5–4.6)
Potassium: 3.9 mmol/L (ref 3.5–5.1)
SODIUM: 139 mmol/L (ref 135–145)

## 2018-04-14 LAB — CBC
HEMATOCRIT: 26.8 % — AB (ref 39.0–52.0)
Hemoglobin: 8.1 g/dL — ABNORMAL LOW (ref 13.0–17.0)
MCH: 27 pg (ref 26.0–34.0)
MCHC: 30.2 g/dL (ref 30.0–36.0)
MCV: 89.3 fL (ref 78.0–100.0)
Platelets: 204 10*3/uL (ref 150–400)
RBC: 3 MIL/uL — AB (ref 4.22–5.81)
RDW: 21.2 % — AB (ref 11.5–15.5)
WBC: 6.7 10*3/uL (ref 4.0–10.5)

## 2018-04-14 LAB — MAGNESIUM: Magnesium: 2.2 mg/dL (ref 1.7–2.4)

## 2018-04-14 MED ORDER — GENERIC EXTERNAL MEDICATION
Status: DC
Start: ? — End: 2018-04-14

## 2018-04-14 MED ORDER — LIDOCAINE HCL (PF) 1 % IJ SOLN
INTRAMUSCULAR | Status: AC
  Filled 2018-04-14: qty 30

## 2018-04-14 NOTE — Progress Notes (Signed)
  Echocardiogram 2D Echocardiogram has been performed.  Leta Jungling M 04/14/2018, 8:23 AM

## 2018-04-14 NOTE — Progress Notes (Signed)
Patient with suspected left pleural effusion by CXR.  [facility] US Chest shows only a small left pleural effusion.  Discussed merits of therapeutic thoracentesis with patient and NP, procedure deferred at this time.  Results of [place] dictated separately.   Loyce Dys, MS RD PA-C 11:08 AM

## 2018-04-14 NOTE — Progress Notes (Signed)
Pulmonary Critical Care Medicine [facility] [place]   PULMONARY SERVICE  PROGRESS NOTE  Date of Service: 04/14/2018  Chris [name]  WUJ:[id number]  DOB: [birth year]   DOA: 04/06/2018  Referring Physician: Carron Curie, MD  HPI: Chris [name] is a 84 y.o. male seen for follow up of Acute on Chronic Respiratory Failure.  Patient currently is on T collar has been on 28% oxygen.  No distress noted at this time.  Medications: Reviewed on Rounds  Physical Exam:  Vitals: Temperature 98.1 pulse 56 respiratory rate 16 blood pressure 123/74 saturations 95%  Ventilator Settings currently on T collar off the ventilator  . General: Comfortable at this time . Eyes: Grossly normal lids, irises & conjunctiva . ENT: grossly tongue is normal . Neck: no obvious mass . Cardiovascular: S1 S2 normal no gallop . Respiratory: No rhonchi or rales are noted . Abdomen: soft . Skin: no rash seen on limited exam . Musculoskeletal: not rigid . Psychiatric:unable to assess . Neurologic: no seizure no involuntary movements         Labs on Admission:  Basic Metabolic Panel: Recent Labs  Lab 04/11/18 0439 04/13/18 0842 04/14/18 0509  NA 142 141 139  K 3.7 3.6 3.9  CL 103 101 104  CO2 33* 33* 28  GLUCOSE 87 84 86  BUN 30* 30* 37*  CREATININE 0.74 0.82 0.81  CALCIUM 8.6* 8.5* 8.3*  MG  --  2.0 2.2  PHOS  --  3.3 3.2    Liver Function Tests: Recent Labs  Lab 04/13/18 0842 04/14/18 0509  ALBUMIN 2.6* 2.4*   No results for input(s): LIPASE, AMYLASE in the last 168 hours. No results for input(s): AMMONIA in the last 168 hours.  CBC: Recent Labs  Lab 04/11/18 0439 04/13/18 0842 04/14/18 0509  WBC 6.0 7.7 6.7  HGB 7.9* 8.7* 8.1*  HCT 26.3* 29.2* 26.8*  MCV 89.2 88.5 89.3  PLT 260 217 204    Cardiac Enzymes: No results for input(s): CKTOTAL, CKMB, CKMBINDEX, TROPONINI in the last 168 hours.  BNP (last 3 results) No results for input(s): BNP in the last 8760  hours.  ProBNP (last 3 results) No results for input(s): PROBNP in the last 8760 hours.  Radiological Exams on Admission: [place] Chest (pleural Effusion)  Result Date: 04/14/2018 CLINICAL DATA:  Pleural effusion. EXAM: CHEST ULTRASOUND COMPARISON:  Radiograph of Apr 13, 2018. FINDINGS: Small pleural effusion is noted on the left. Minimal pleural effusion is noted on the right. IMPRESSION: Small left pleural effusion.  Minimal right pleural effusion. Electronically Signed   By: Lupita Raider, M.D.   On: 04/14/2018 10:15   Dg Chest Port 1 View  Result Date: 04/13/2018 CLINICAL DATA:  Shortness of breath EXAM: PORTABLE CHEST 1 VIEW COMPARISON:  04/09/2018 FINDINGS: Tracheostomy tube is again identified. Cardiac enlargement is again seen. Stable aortic calcifications are noted. The right lung is predominately well aerated although some basilar consolidation with a small effusion is again seen. Diffuse left-sided consolidation with large effusion is seen. These changes are similar to that noted on prior CT examination. No new focal abnormality is noted. IMPRESSION: Stable appearance of the chest when compare with the prior CT. Bilateral airspace consolidation with effusions is seen worse on the left than the right. Electronically Signed   By: Alcide Clever M.D.   On: 04/13/2018 09:00    Assessment/Plan Principal Problem:   Acute on chronic respiratory failure with hypoxia (HCC) Active Problems:   MRSA pneumonia (HCC)  Gram-negative pneumonia (HCC)   Pleural effusion   Chronic atrial fibrillation (HCC)   1. Acute on chronic respiratory failure with hypoxia we will continue with T collar as tolerated patient's at baseline.  No decannulation planned. 2. MRSA pneumonia treated with antibiotics continue with supportive care 3. Gram-negative pneumonia treated we will continue to follow 4. Pleural effusion stable we will continue to follow 5. Chronic atrial fibrillation rate is controlled at this  time   I have personally seen and evaluated the patient, evaluated laboratory and imaging results, formulated the assessment and plan and placed orders. The Patient requires high complexity decision making for assessment and support.  Case was discussed on Rounds with the Respiratory Therapy Staff  Yevonne Pax, MD [facility] Pulmonary Critical Care Medicine Sleep Medicine

## 2018-04-15 DIAGNOSIS — J15212 Pneumonia due to Methicillin resistant Staphylococcus aureus: Secondary | ICD-10-CM | POA: Diagnosis not present

## 2018-04-15 DIAGNOSIS — J9621 Acute and chronic respiratory failure with hypoxia: Secondary | ICD-10-CM | POA: Diagnosis not present

## 2018-04-15 DIAGNOSIS — I482 Chronic atrial fibrillation: Secondary | ICD-10-CM | POA: Diagnosis not present

## 2018-04-15 DIAGNOSIS — J156 Pneumonia due to other aerobic Gram-negative bacteria: Secondary | ICD-10-CM | POA: Diagnosis not present

## 2018-04-15 NOTE — Progress Notes (Signed)
Pulmonary Critical Care Medicine [facility] [place]   PULMONARY SERVICE  PROGRESS NOTE  Date of Service: 04/15/2018  Chris [name]  ZOX:[id number]  DOB: March 05, 1929   DOA: 04/06/2018  Referring Physician: Carron Curie, MD  HPI: Chris [name] is a 84 y.o. male seen for follow up of Acute on Chronic Respiratory Failure.  Comfortable no distress remains on T collar at this time.  Medications: Reviewed on Rounds  Physical Exam:  Vitals: Temperature 98.1 pulse 60 respiratory rate 16 blood pressure 148/70 saturations 96%  Ventilator Settings currently is off the ventilator T collar  . General: Comfortable at this time . Eyes: Grossly normal lids, irises & conjunctiva . ENT: grossly tongue is normal . Neck: no obvious mass . Cardiovascular: S1 S2 normal no gallop . Respiratory: No rhonchi or rales . Abdomen: soft . Skin: no rash seen on limited exam . Musculoskeletal: not rigid . Psychiatric:unable to assess . Neurologic: no seizure no involuntary movements         Labs on Admission:  Basic Metabolic Panel: Recent Labs  Lab 04/11/18 0439 04/13/18 0842 04/14/18 0509  NA 142 141 139  K 3.7 3.6 3.9  CL 103 101 104  CO2 33* 33* 28  GLUCOSE 87 84 86  BUN 30* 30* 37*  CREATININE 0.74 0.82 0.81  CALCIUM 8.6* 8.5* 8.3*  MG  --  2.0 2.2  PHOS  --  3.3 3.2    Liver Function Tests: Recent Labs  Lab 04/13/18 0842 04/14/18 0509  ALBUMIN 2.6* 2.4*   No results for input(s): LIPASE, AMYLASE in the last 168 hours. No results for input(s): AMMONIA in the last 168 hours.  CBC: Recent Labs  Lab 04/11/18 0439 04/13/18 0842 04/14/18 0509  WBC 6.0 7.7 6.7  HGB 7.9* 8.7* 8.1*  HCT 26.3* 29.2* 26.8*  MCV 89.2 88.5 89.3  PLT 260 217 204    Cardiac Enzymes: No results for input(s): CKTOTAL, CKMB, CKMBINDEX, TROPONINI in the last 168 hours.  BNP (last 3 results) No results for input(s): BNP in the last 8760 hours.  ProBNP (last 3 results) No results for  input(s): PROBNP in the last 8760 hours.  Radiological Exams on Admission: [place] Chest (pleural Effusion)  Result Date: 04/14/2018 CLINICAL DATA:  Pleural effusion. EXAM: CHEST ULTRASOUND COMPARISON:  Radiograph of Apr 13, 2018. FINDINGS: Small pleural effusion is noted on the left. Minimal pleural effusion is noted on the right. IMPRESSION: Small left pleural effusion.  Minimal right pleural effusion. Electronically Signed   By: Lupita Raider, M.D.   On: 04/14/2018 10:15   Dg Chest Port 1 View  Result Date: 04/13/2018 CLINICAL DATA:  Shortness of breath EXAM: PORTABLE CHEST 1 VIEW COMPARISON:  04/09/2018 FINDINGS: Tracheostomy tube is again identified. Cardiac enlargement is again seen. Stable aortic calcifications are noted. The right lung is predominately well aerated although some basilar consolidation with a small effusion is again seen. Diffuse left-sided consolidation with large effusion is seen. These changes are similar to that noted on prior CT examination. No new focal abnormality is noted. IMPRESSION: Stable appearance of the chest when compare with the prior CT. Bilateral airspace consolidation with effusions is seen worse on the left than the right. Electronically Signed   By: Alcide Clever M.D.   On: 04/13/2018 09:00    Assessment/Plan Principal Problem:   Acute on chronic respiratory failure with hypoxia (HCC) Active Problems:   MRSA pneumonia (HCC)   Gram-negative pneumonia (HCC)   Pleural effusion  Chronic atrial fibrillation (HCC)   1. Acute on chronic respiratory failure with hypoxia we will continue with T collar patient is at baseline doing fine.  Secretions are fair to moderate we will continue supportive care 2. MRSA pneumonia treated we will continue to follow on x-ray results. 3. Gram-negative pneumonia also treated we will continue to monitor. 4. Pleural effusion stable we will continue to follow x-ray results if increased will need thoracentesis 5. Chronic atrial  fibrillation rate is controlled   I have personally seen and evaluated the patient, evaluated laboratory and imaging results, formulated the assessment and plan and placed orders. The Patient requires high complexity decision making for assessment and support.  Case was discussed on Rounds with the Respiratory Therapy Staff  Yevonne Pax, MD [facility] Pulmonary Critical Care Medicine Sleep Medicine

## 2018-04-16 ENCOUNTER — Other Ambulatory Visit (HOSPITAL_COMMUNITY): Payer: Medicare HMO

## 2018-04-16 DIAGNOSIS — J156 Pneumonia due to other aerobic Gram-negative bacteria: Secondary | ICD-10-CM | POA: Diagnosis not present

## 2018-04-16 DIAGNOSIS — J9621 Acute and chronic respiratory failure with hypoxia: Secondary | ICD-10-CM | POA: Diagnosis not present

## 2018-04-16 DIAGNOSIS — I482 Chronic atrial fibrillation: Secondary | ICD-10-CM | POA: Diagnosis not present

## 2018-04-16 DIAGNOSIS — J15212 Pneumonia due to Methicillin resistant Staphylococcus aureus: Secondary | ICD-10-CM | POA: Diagnosis not present

## 2018-04-16 LAB — RENAL FUNCTION PANEL
ANION GAP: 12 (ref 5–15)
Albumin: 2.6 g/dL — ABNORMAL LOW (ref 3.5–5.0)
BUN: 37 mg/dL — AB (ref 6–20)
CO2: 24 mmol/L (ref 22–32)
Calcium: 8.6 mg/dL — ABNORMAL LOW (ref 8.9–10.3)
Chloride: 103 mmol/L (ref 101–111)
Creatinine, Ser: 0.8 mg/dL (ref 0.61–1.24)
GFR calc Af Amer: >60 mL/min (ref >60)
GFR calc non Af Amer: >60 mL/min (ref >60)
GLUCOSE: 77 mg/dL (ref 65–99)
PHOSPHORUS: 2.7 mg/dL (ref 2.5–4.6)
POTASSIUM: 3.8 mmol/L (ref 3.5–5.1)
Sodium: 139 mmol/L (ref 135–145)

## 2018-04-16 LAB — CBC
HEMATOCRIT: 28.8 % — AB (ref 39.0–52.0)
Hemoglobin: 8.8 g/dL — ABNORMAL LOW (ref 13.0–17.0)
MCH: 26.7 pg (ref 26.0–34.0)
MCHC: 30.6 g/dL (ref 30.0–36.0)
MCV: 87.3 fL (ref 78.0–100.0)
Platelets: 238 10*3/uL (ref 150–400)
RBC: 3.3 MIL/uL — ABNORMAL LOW (ref 4.22–5.81)
RDW: 21.9 % — AB (ref 11.5–15.5)
WBC: 7.7 10*3/uL (ref 4.0–10.5)

## 2018-04-16 LAB — MAGNESIUM: Magnesium: 2.2 mg/dL (ref 1.7–2.4)

## 2018-04-16 MED ORDER — GENERIC EXTERNAL MEDICATION
Status: DC
Start: ? — End: 2018-04-16

## 2018-04-16 NOTE — Progress Notes (Signed)
[facility] [place]   PULMONARY SERVICE  PROGRESS NOTE  Date of Service: 04/16/2018  Chris PillingJohn H Leiva  ZOX:[id number]  DOB: [birth year]   DOA: 04/06/2018  Referring Physician: Carron CurieAli Hijazi, MD  HPI: Chris [name] is a 84 y.o. male seen for follow up of Acute on Chronic Respiratory Failure.  Comfortable no distress remains on T collar currently is on 28% oxygen good saturations are noted.  Medications: Reviewed on Rounds  Physical Exam:  Vitals: Temperature 97.0 pulse 76 respiratory 28 blood pressure 130/62 saturations 94%  Ventilator Settings currently is on aerosolized T collar  . General: Comfortable at this time . Eyes: Grossly normal lids, irises & conjunctiva . ENT: grossly tongue is normal . Neck: no obvious mass . Cardiovascular: S1 S2 normal no gallop . Respiratory: No rhonchi expansion equal . Abdomen: soft . Skin: no rash seen on limited exam . Musculoskeletal: not rigid . Psychiatric:unable to assess . Neurologic: no seizure no involuntary movements         Labs on Admission:  Basic Metabolic Panel: Recent Labs  Lab 04/11/18 0439 04/13/18 0842 04/14/18 0509 04/16/18 0841  NA 142 141 139 139  K 3.7 3.6 3.9 3.8  CL 103 101 104 103  CO2 33* 33* 28 24  GLUCOSE 87 84 86 77  BUN 30* 30* 37* 37*  CREATININE 0.74 0.82 0.81 0.80  CALCIUM 8.6* 8.5* 8.3* 8.6*  MG  --  2.0 2.2 2.2  PHOS  --  3.3 3.2 2.7    Liver Function Tests: Recent Labs  Lab 04/13/18 0842 04/14/18 0509 04/16/18 0841  ALBUMIN 2.6* 2.4* 2.6*   No results for input(s): LIPASE, AMYLASE in the last 168 hours. No results for input(s): AMMONIA in the last 168 hours.  CBC: Recent Labs  Lab 04/11/18 0439 04/13/18 0842 04/14/18 0509 04/16/18 0841  WBC 6.0 7.7 6.7 7.7  HGB 7.9* 8.7* 8.1* 8.8*  HCT 26.3* 29.2* 26.8* 28.8*  MCV 89.2 88.5 89.3 87.3  PLT 260 217 204 238    Cardiac Enzymes: No results for input(s): CKTOTAL, CKMB, CKMBINDEX,  TROPONINI in the last 168 hours.  BNP (last 3 results) No results for input(s): BNP in the last 8760 hours.  ProBNP (last 3 results) No results for input(s): PROBNP in the last 8760 hours.  Radiological Exams on Admission: [place] Chest (pleural Effusion)  Result Date: 04/14/2018 CLINICAL DATA:  Pleural effusion. EXAM: CHEST ULTRASOUND COMPARISON:  Radiograph of Apr 13, 2018. FINDINGS: Small pleural effusion is noted on the left. Minimal pleural effusion is noted on the right. IMPRESSION: Small left pleural effusion.  Minimal right pleural effusion. Electronically Signed   By: Lupita RaiderJames  Green Jr, M.D.   On: 04/14/2018 10:15   Dg Chest Port 1 View  Result Date: 04/16/2018 CLINICAL DATA:  Respiratory failure.  Pleural effusion. EXAM: PORTABLE CHEST 1 VIEW COMPARISON:  Single-view of the chest 04/13/2017 and 04/07/2018. CT chest 04/09/2018. FINDINGS: Tracheostomy tube remains in place. Aeration in the left chest is markedly improved since yesterday's examination. There is mild right basilar atelectasis. Cardiomegaly is seen. Aortic atherosclerosis noted. No pneumothorax. IMPRESSION: Marked improvement in aeration in the left chest. No new abnormality. Electronically Signed   By: Drusilla Kannerhomas  Dalessio M.D.   On: 04/16/2018 08:56   Dg Chest Port 1 View  Result Date: 04/13/2018 CLINICAL DATA:  Shortness of breath EXAM: PORTABLE CHEST 1 VIEW COMPARISON:  04/09/2018 FINDINGS: Tracheostomy tube is again identified. Cardiac enlargement is again seen. Stable aortic  calcifications are noted. The right lung is predominately well aerated although some basilar consolidation with a small effusion is again seen. Diffuse left-sided consolidation with large effusion is seen. These changes are similar to that noted on prior CT examination. No new focal abnormality is noted. IMPRESSION: Stable appearance of the chest when compare with the prior CT. Bilateral airspace consolidation with effusions is seen worse on the left than the  right. Electronically Signed   By: Alcide Clever M.D.   On: 04/13/2018 09:00    Assessment/Plan Principal Problem:   Acute on chronic respiratory failure with hypoxia (HCC) Active Problems:   MRSA pneumonia (HCC)   Gram-negative pneumonia (HCC)   Pleural effusion   Chronic atrial fibrillation (HCC)   1. Acute on chronic respiratory failure with hypoxia we will continue with T collar at baseline.  Continue secretion management pulmonary toilet. 2. MRSA pneumonia treated with antibiotics we will continue to follow 3. Pleural effusion at baseline we will continue to monitor. 4. Chronic atrial fibrillation rate is controlled 5. Gram-negative pneumonia treated   I have personally seen and evaluated the patient, evaluated laboratory and imaging results, formulated the assessment and plan and placed orders. The Patient requires high complexity decision making for assessment and support.  Case was discussed on Rounds with the Respiratory Therapy Staff  Yevonne Pax, MD [facility] Pulmonary Critical Care Medicine Sleep Medicine

## 2018-04-17 ENCOUNTER — Other Ambulatory Visit (HOSPITAL_COMMUNITY): Payer: Medicare HMO

## 2018-04-17 DIAGNOSIS — I482 Chronic atrial fibrillation: Secondary | ICD-10-CM | POA: Diagnosis not present

## 2018-04-17 DIAGNOSIS — J156 Pneumonia due to other aerobic Gram-negative bacteria: Secondary | ICD-10-CM | POA: Diagnosis not present

## 2018-04-17 DIAGNOSIS — J9621 Acute and chronic respiratory failure with hypoxia: Secondary | ICD-10-CM | POA: Diagnosis not present

## 2018-04-17 DIAGNOSIS — J15212 Pneumonia due to Methicillin resistant Staphylococcus aureus: Secondary | ICD-10-CM | POA: Diagnosis not present

## 2018-04-17 MED ORDER — GENERIC EXTERNAL MEDICATION
Status: DC
Start: ? — End: 2018-04-17

## 2018-04-17 NOTE — Progress Notes (Signed)
Pulmonary Critical Care Medicine [facility] [place]   PULMONARY SERVICE  PROGRESS NOTE  Date of Service: 04/17/2018  Chris [name]  RUE:[id number]  DOB: 1929/09/04   DOA: 04/06/2018  Referring Physician: Carron Curie, MD  HPI: Chris [name] is a 84 y.o. male seen for follow up of Acute on Chronic Respiratory Failure.  Patient is comfortable without distress remains on T collar.  Has been tolerating 28% FiO2 at baseline.  Medications: Reviewed on Rounds  Physical Exam:  Vitals: Temperature 97.6 pulse 53 respiratory rate 18 blood pressure 135/58 saturations 97%  Ventilator Settings currently on T collar will continue with supportive care  . General: Comfortable at this time . Eyes: Grossly normal lids, irises & conjunctiva . ENT: grossly tongue is normal . Neck: no obvious mass . Cardiovascular: S1 S2 normal no gallop . Respiratory: No rhonchi expansion is equal . Abdomen: soft . Skin: no rash seen on limited exam . Musculoskeletal: not rigid . Psychiatric:unable to assess . Neurologic: no seizure no involuntary movements         Labs on Admission:  Basic Metabolic Panel: Recent Labs  Lab 04/11/18 0439 04/13/18 0842 04/14/18 0509 04/16/18 0841  NA 142 141 139 139  K 3.7 3.6 3.9 3.8  CL 103 101 104 103  CO2 33* 33* 28 24  GLUCOSE 87 84 86 77  BUN 30* 30* 37* 37*  CREATININE 0.74 0.82 0.81 0.80  CALCIUM 8.6* 8.5* 8.3* 8.6*  MG  --  2.0 2.2 2.2  PHOS  --  3.3 3.2 2.7    Liver Function Tests: Recent Labs  Lab 04/13/18 0842 04/14/18 0509 04/16/18 0841  ALBUMIN 2.6* 2.4* 2.6*   No results for input(s): LIPASE, AMYLASE in the last 168 hours. No results for input(s): AMMONIA in the last 168 hours.  CBC: Recent Labs  Lab 04/11/18 0439 04/13/18 0842 04/14/18 0509 04/16/18 0841  WBC 6.0 7.7 6.7 7.7  HGB 7.9* 8.7* 8.1* 8.8*  HCT 26.3* 29.2* 26.8* 28.8*  MCV 89.2 88.5 89.3 87.3  PLT 260 217 204 238    Cardiac Enzymes: No results for input(s):  CKTOTAL, CKMB, CKMBINDEX, TROPONINI in the last 168 hours.  BNP (last 3 results) No results for input(s): BNP in the last 8760 hours.  ProBNP (last 3 results) No results for input(s): PROBNP in the last 8760 hours.  Radiological Exams on Admission: [place] Chest (pleural Effusion)  Result Date: 04/14/2018 CLINICAL DATA:  Pleural effusion. EXAM: CHEST ULTRASOUND COMPARISON:  Radiograph of Apr 13, 2018. FINDINGS: Small pleural effusion is noted on the left. Minimal pleural effusion is noted on the right. IMPRESSION: Small left pleural effusion.  Minimal right pleural effusion. Electronically Signed   By: Lupita Raider, M.D.   On: 04/14/2018 10:15   Dg Chest Port 1 View  Result Date: 04/16/2018 CLINICAL DATA:  Respiratory failure.  Pleural effusion. EXAM: PORTABLE CHEST 1 VIEW COMPARISON:  Single-view of the chest 04/13/2017 and 04/07/2018. CT chest 04/09/2018. FINDINGS: Tracheostomy tube remains in place. Aeration in the left chest is markedly improved since yesterday's examination. There is mild right basilar atelectasis. Cardiomegaly is seen. Aortic atherosclerosis noted. No pneumothorax. IMPRESSION: Marked improvement in aeration in the left chest. No new abnormality. Electronically Signed   By: Drusilla Kanner M.D.   On: 04/16/2018 08:56    Assessment/Plan Principal Problem:   Acute on chronic respiratory failure with hypoxia (HCC) Active Problems:   MRSA pneumonia (HCC)   Gram-negative pneumonia (HCC)   Pleural effusion  Chronic atrial fibrillation (HCC)   1. Acute on chronic respiratory failure with hypoxia continue T collar as ordered he is at baseline on 28% FiO2.  Continue with aggressive pulmonary toilet and follow along. 2. MRSA pneumonia gram-negative pneumonia treated with antibiotics we will continue to monitor. 3. Pleural effusion monitor x-rays as necessary.  The last chest film showed market improvement 4. Chronic atrial fibrillation rate is controlled we will continue  with supportive care   I have personally seen and evaluated the patient, evaluated laboratory and imaging results, formulated the assessment and plan and placed orders. The Patient requires high complexity decision making for assessment and support.  Case was discussed on Rounds with the Respiratory Therapy Staff  Yevonne PaxSaadat A Adryanna Friedt, MD [facility] Pulmonary Critical Care Medicine Sleep Medicine

## 2018-04-18 ENCOUNTER — Other Ambulatory Visit (HOSPITAL_COMMUNITY): Payer: Medicare HMO

## 2018-04-18 DIAGNOSIS — J156 Pneumonia due to other aerobic Gram-negative bacteria: Secondary | ICD-10-CM | POA: Diagnosis not present

## 2018-04-18 DIAGNOSIS — J9621 Acute and chronic respiratory failure with hypoxia: Secondary | ICD-10-CM | POA: Diagnosis not present

## 2018-04-18 DIAGNOSIS — J15212 Pneumonia due to Methicillin resistant Staphylococcus aureus: Secondary | ICD-10-CM | POA: Diagnosis not present

## 2018-04-18 DIAGNOSIS — I482 Chronic atrial fibrillation: Secondary | ICD-10-CM | POA: Diagnosis not present

## 2018-04-18 LAB — CULTURE, RESPIRATORY

## 2018-04-18 LAB — RENAL FUNCTION PANEL
Albumin: 2.3 g/dL — ABNORMAL LOW (ref 3.5–5.0)
Anion gap: 6 (ref 5–15)
BUN: 38 mg/dL — ABNORMAL HIGH (ref 6–20)
CALCIUM: 8.5 mg/dL — AB (ref 8.9–10.3)
CHLORIDE: 108 mmol/L (ref 101–111)
CO2: 27 mmol/L (ref 22–32)
CREATININE: 0.88 mg/dL (ref 0.61–1.24)
GFR calc Af Amer: >60 mL/min (ref >60)
GFR calc non Af Amer: >60 mL/min (ref >60)
GLUCOSE: 107 mg/dL — AB (ref 65–99)
Phosphorus: 3.4 mg/dL (ref 2.5–4.6)
Potassium: 3.5 mmol/L (ref 3.5–5.1)
SODIUM: 141 mmol/L (ref 135–145)

## 2018-04-18 LAB — PREPARE RBC (CROSSMATCH)

## 2018-04-18 LAB — CBC
HCT: 23.2 % — ABNORMAL LOW (ref 39.0–52.0)
Hemoglobin: 6.9 g/dL — CL (ref 13.0–17.0)
MCH: 26.6 pg (ref 26.0–34.0)
MCHC: 29.7 g/dL — AB (ref 30.0–36.0)
MCV: 89.6 fL (ref 78.0–100.0)
Platelets: 177 10*3/uL (ref 150–400)
RBC: 2.59 MIL/uL — ABNORMAL LOW (ref 4.22–5.81)
RDW: 21.4 % — AB (ref 11.5–15.5)
WBC: 5.7 10*3/uL (ref 4.0–10.5)

## 2018-04-18 LAB — CULTURE, RESPIRATORY W GRAM STAIN

## 2018-04-18 LAB — ABO/RH: ABO/RH(D): B POS

## 2018-04-18 LAB — MAGNESIUM: MAGNESIUM: 2.3 mg/dL (ref 1.7–2.4)

## 2018-04-18 NOTE — Progress Notes (Signed)
[facility]   PULMONARY SERVICE  PROGRESS NOTE  Date of Service: 04/18/2018  Chris PillingJohn H Coffield  ZOX:[id number]  DOB: [birth year]   DOA: 04/06/2018  Referring Physician: Carron CurieAli Hijazi, MD  HPI: Chris [name] is a 84 y.o. male seen for follow up of Acute on Chronic Respiratory Failure.  Currently on T collar at baseline on 28% oxygen.  Medications: Reviewed on Rounds  Physical Exam:  Vitals: Temperature 97.4 pulse 61 respiratory rate 12 blood pressure 127/62 saturations 100%  Ventilator Settings off the ventilator on T collar  . General: Comfortable at this time . Eyes: Grossly normal lids, irises & conjunctiva . ENT: grossly tongue is normal . Neck: no obvious mass . Cardiovascular: S1 S2 normal no gallop . Respiratory: Coarse breath sounds bilaterally . Abdomen: soft . Skin: no rash seen on limited exam . Musculoskeletal: not rigid . Psychiatric:unable to assess . Neurologic: no seizure no involuntary movements         Lab Data:   Basic Metabolic Panel: Recent Labs  Lab 04/13/18 0842 04/14/18 0509 04/16/18 0841 04/18/18 0517  NA 141 139 139 141  K 3.6 3.9 3.8 3.5  CL 101 104 103 108  CO2 33* 28 24 27   GLUCOSE 84 86 77 107*  BUN 30* 37* 37* 38*  CREATININE 0.82 0.81 0.80 0.88  CALCIUM 8.5* 8.3* 8.6* 8.5*  MG 2.0 2.2 2.2 2.3  PHOS 3.3 3.2 2.7 3.4    Liver Function Tests: Recent Labs  Lab 04/13/18 0842 04/14/18 0509 04/16/18 0841 04/18/18 0517  ALBUMIN 2.6* 2.4* 2.6* 2.3*   No results for input(s): LIPASE, AMYLASE in the last 168 hours. No results for input(s): AMMONIA in the last 168 hours.  CBC: Recent Labs  Lab 04/13/18 0842 04/14/18 0509 04/16/18 0841 04/18/18 0517  WBC 7.7 6.7 7.7 5.7  HGB 8.7* 8.1* 8.8* 6.9*  HCT 29.2* 26.8* 28.8* 23.2*  MCV 88.5 89.3 87.3 89.6  PLT 217 204 238 177    Cardiac Enzymes: No results for input(s): CKTOTAL, CKMB, CKMBINDEX, TROPONINI in the last 168 hours.  BNP  (last 3 results) No results for input(s): BNP in the last 8760 hours.  ProBNP (last 3 results) No results for input(s): PROBNP in the last 8760 hours.  Radiological Exams: Dg Chest Port 1 View  Result Date: 04/18/2018 CLINICAL DATA:  Acute on chronic respiratory failure, pneumonia, chronic atrial fibrillation, tracheostomy patient. EXAM: PORTABLE CHEST 1 VIEW COMPARISON:  Portable chest x-ray of April 16, 2018 FINDINGS: The lungs are adequately inflated. There are patchy increased densities at the lung bases greatest on the right. However, overall the appearance of the lungs has improved. The cardiac silhouette remains enlarged. The pulmonary vascularity is not engorged. There is calcification in the wall of the aortic arch. The tracheostomy tube tip projects at the inferior margin of the clavicular heads. The bony thorax exhibits no acute abnormality. IMPRESSION: Bibasilar atelectasis, improving. Decreased pulmonary interstitial edema. Stable enlargement of the cardiac silhouette. Thoracic aortic atherosclerosis. Electronically Signed   By: David  SwazilandJordan M.D.   On: 04/18/2018 07:27    Assessment/Plan Principal Problem:   Acute on chronic respiratory failure with hypoxia (HCC) Active Problems:   MRSA pneumonia (HCC)   Gram-negative pneumonia (HCC)   Pleural effusion   Chronic atrial fibrillation (HCC)   1. Acute on chronic respiratory failure with hypoxia we will continue with T collar trials as ordered.  Patient is at baseline continue pulmonary toilet supportive care 2.  Gram-negative pneumonia MRSA pneumonia treated we will continue with supportive care monitor x-rays. 3. Pleural effusion stable will follow 4. Chronic atrial fibrillation rate is controlled   I have personally seen and evaluated the patient, evaluated laboratory and imaging results, formulated the assessment and plan and placed orders. The Patient requires high complexity decision making for assessment and support.  Case  was discussed on Rounds with the Respiratory Therapy Staff  Yevonne Pax, MD [facility] Pulmonary Critical Care Medicine Sleep Medicine

## 2018-04-19 LAB — TYPE AND SCREEN
ABO/RH(D): B POS
Antibody Screen: NEGATIVE
Unit division: 0

## 2018-04-19 LAB — BPAM RBC
BLOOD PRODUCT EXPIRATION DATE: 201906042359
ISSUE DATE / TIME: 201906031056
Unit Type and Rh: 7300

## 2018-04-19 LAB — CBC
HCT: 26 % — ABNORMAL LOW (ref 39.0–52.0)
HEMOGLOBIN: 7.9 g/dL — AB (ref 13.0–17.0)
MCH: 27 pg (ref 26.0–34.0)
MCHC: 30.4 g/dL (ref 30.0–36.0)
MCV: 88.7 fL (ref 78.0–100.0)
PLATELETS: 221 10*3/uL (ref 150–400)
RBC: 2.93 MIL/uL — ABNORMAL LOW (ref 4.22–5.81)
RDW: 21.7 % — ABNORMAL HIGH (ref 11.5–15.5)
WBC: 5.3 10*3/uL (ref 4.0–10.5)

## 2018-04-19 LAB — OCCULT BLOOD X 1 CARD TO LAB, STOOL: FECAL OCCULT BLD: POSITIVE — AB

## 2018-04-20 DIAGNOSIS — J15212 Pneumonia due to Methicillin resistant Staphylococcus aureus: Secondary | ICD-10-CM | POA: Diagnosis not present

## 2018-04-20 DIAGNOSIS — I482 Chronic atrial fibrillation: Secondary | ICD-10-CM | POA: Diagnosis not present

## 2018-04-20 DIAGNOSIS — J9621 Acute and chronic respiratory failure with hypoxia: Secondary | ICD-10-CM | POA: Diagnosis not present

## 2018-04-20 DIAGNOSIS — J156 Pneumonia due to other aerobic Gram-negative bacteria: Secondary | ICD-10-CM | POA: Diagnosis not present

## 2018-04-20 NOTE — Progress Notes (Signed)
[facility] [place]   PULMONARY SERVICE  PROGRESS NOTE  Date of Service: 04/20/2018  Chris PillingJohn H Amato  XBJ:[id number]  DOB: 1929/07/27   DOA: 04/06/2018  Referring Physician: Carron CurieAli Hijazi, MD  HPI: Chris [name] is a 84 y.o. male seen for follow up of Acute on Chronic Respiratory Failure.  Patient is tolerating T collar fairly well right now is on 28% oxygen.  Desaturations are noted  Medications: Reviewed on Rounds  Physical Exam:  Vitals: Temperature 96.9 pulse 108 respiratory rate 16 blood pressure 142/68 saturations 97%  Ventilator Settings aerosolized T collar FiO2 28%  . General: Comfortable at this time . Eyes: Grossly normal lids, irises & conjunctiva . ENT: grossly tongue is normal . Neck: no obvious mass . Cardiovascular: S1 S2 normal no gallop . Respiratory: No rhonchi expansion is equal . Abdomen: soft . Skin: no rash seen on limited exam . Musculoskeletal: not rigid . Psychiatric:unable to assess . Neurologic: no seizure no involuntary movements         Lab Data:   Basic Metabolic Panel: Recent Labs  Lab 04/14/18 0509 04/16/18 0841 04/18/18 0517  NA 139 139 141  K 3.9 3.8 3.5  CL 104 103 108  CO2 28 24 27   GLUCOSE 86 77 107*  BUN 37* 37* 38*  CREATININE 0.81 0.80 0.88  CALCIUM 8.3* 8.6* 8.5*  MG 2.2 2.2 2.3  PHOS 3.2 2.7 3.4    Liver Function Tests: Recent Labs  Lab 04/14/18 0509 04/16/18 0841 04/18/18 0517  ALBUMIN 2.4* 2.6* 2.3*   No results for input(s): LIPASE, AMYLASE in the last 168 hours. No results for input(s): AMMONIA in the last 168 hours.  CBC: Recent Labs  Lab 04/14/18 0509 04/16/18 0841 04/18/18 0517 04/19/18 0619  WBC 6.7 7.7 5.7 5.3  HGB 8.1* 8.8* 6.9* 7.9*  HCT 26.8* 28.8* 23.2* 26.0*  MCV 89.3 87.3 89.6 88.7  PLT 204 238 177 221    Cardiac Enzymes: No results for input(s): CKTOTAL, CKMB, CKMBINDEX, TROPONINI in the last 168 hours.  BNP (last 3 results) No results for  input(s): BNP in the last 8760 hours.  ProBNP (last 3 results) No results for input(s): PROBNP in the last 8760 hours.  Radiological Exams: No results found.  Assessment/Plan Principal Problem:   Acute on chronic respiratory failure with hypoxia (HCC) Active Problems:   MRSA pneumonia (HCC)   Gram-negative pneumonia (HCC)   Pleural effusion   Chronic atrial fibrillation (HCC)   1. Acute on chronic respiratory failure with hypoxia we will continue with T collar trials continue secretion management pulmonary toilet patient's at baseline with the T collar with his history of head neck cancer 2. MRSA pneumonia treated continue to monitor 3. Chronic atrial fibrillation rate is controlled 4. Pleural effusion stable 5. Gram-negative pneumonia treated we will monitor   I have personally seen and evaluated the patient, evaluated laboratory and imaging results, formulated the assessment and plan and placed orders. The Patient requires high complexity decision making for assessment and support.  Case was discussed on Rounds with the Respiratory Therapy Staff  Yevonne PaxSaadat A Khan, MD [facility] Pulmonary Critical Care Medicine Sleep Medicine

## 2018-04-21 DIAGNOSIS — J156 Pneumonia due to other aerobic Gram-negative bacteria: Secondary | ICD-10-CM | POA: Diagnosis not present

## 2018-04-21 DIAGNOSIS — J90 Pleural effusion, not elsewhere classified: Secondary | ICD-10-CM

## 2018-04-21 DIAGNOSIS — I482 Chronic atrial fibrillation: Secondary | ICD-10-CM

## 2018-04-21 DIAGNOSIS — J9621 Acute and chronic respiratory failure with hypoxia: Secondary | ICD-10-CM | POA: Diagnosis not present

## 2018-04-21 DIAGNOSIS — J15212 Pneumonia due to Methicillin resistant Staphylococcus aureus: Secondary | ICD-10-CM | POA: Diagnosis not present

## 2018-04-21 LAB — CBC
HCT: 24 % — ABNORMAL LOW (ref 39.0–52.0)
Hemoglobin: 7.2 g/dL — ABNORMAL LOW (ref 13.0–17.0)
MCH: 27.4 pg (ref 26.0–34.0)
MCHC: 30 g/dL (ref 30.0–36.0)
MCV: 91.3 fL (ref 78.0–100.0)
PLATELETS: 171 10*3/uL (ref 150–400)
RBC: 2.63 MIL/uL — ABNORMAL LOW (ref 4.22–5.81)
RDW: 20.6 % — AB (ref 11.5–15.5)
WBC: 5.9 10*3/uL (ref 4.0–10.5)

## 2018-04-21 LAB — BASIC METABOLIC PANEL
ANION GAP: 9 (ref 5–15)
BUN: 47 mg/dL — ABNORMAL HIGH (ref 6–20)
CALCIUM: 8.6 mg/dL — AB (ref 8.9–10.3)
CO2: 26 mmol/L (ref 22–32)
CREATININE: 1.1 mg/dL (ref 0.61–1.24)
Chloride: 107 mmol/L (ref 101–111)
GFR, EST NON AFRICAN AMERICAN: 59 mL/min — AB (ref >60)
Glucose, Bld: 99 mg/dL (ref 65–99)
Potassium: 4.2 mmol/L (ref 3.5–5.1)
SODIUM: 142 mmol/L (ref 135–145)

## 2018-04-21 LAB — HEMOGLOBIN AND HEMATOCRIT, BLOOD
HEMATOCRIT: 26 % — AB (ref 39.0–52.0)
HEMOGLOBIN: 7.7 g/dL — AB (ref 13.0–17.0)

## 2018-04-21 LAB — MAGNESIUM: MAGNESIUM: 2.4 mg/dL (ref 1.7–2.4)

## 2018-04-21 NOTE — Progress Notes (Signed)
[facility] [place]   PULMONARY SERVICE  PROGRESS NOTE  Date of Service: 04/21/2018  Deon PillingJohn H Minarik  ZOX:[id number]  DOB: February 19, 1929   DOA: 04/06/2018  Referring Physician: Carron CurieAli Hijazi, MD  HPI: Deon PillingJohn H Valdivia is a 84 y.o. male seen for follow up of Acute on Chronic Respiratory Failure.  Currently on T collar patient is on 20% oxygen good saturations are noted at this time.  Patient has had no fevers noted  Medications: Reviewed on Rounds  Physical Exam:  Vitals: Temperature 96.5 pulse 70 respiratory 20 blood pressure 115/68 saturations 99%  Ventilator Settings off the ventilator on T collar trials  . General: Comfortable at this time . Eyes: Grossly normal lids, irises & conjunctiva . ENT: grossly tongue is normal . Neck: no obvious mass . Cardiovascular: S1 S2 normal no gallop . Respiratory: No rhonchi noted . Abdomen: soft . Skin: no rash seen on limited exam . Musculoskeletal: not rigid . Psychiatric:unable to assess . Neurologic: no seizure no involuntary movements         Lab Data:   Basic Metabolic Panel: Recent Labs  Lab 04/16/18 0841 04/18/18 0517 04/21/18 0515  NA 139 141 142  K 3.8 3.5 4.2  CL 103 108 107  CO2 24 27 26   GLUCOSE 77 107* 99  BUN 37* 38* 47*  CREATININE 0.80 0.88 1.10  CALCIUM 8.6* 8.5* 8.6*  MG 2.2 2.3 2.4  PHOS 2.7 3.4  --     Liver Function Tests: Recent Labs  Lab 04/16/18 0841 04/18/18 0517  ALBUMIN 2.6* 2.3*   No results for input(s): LIPASE, AMYLASE in the last 168 hours. No results for input(s): AMMONIA in the last 168 hours.  CBC: Recent Labs  Lab 04/16/18 0841 04/18/18 0517 04/19/18 0619 04/21/18 0515 04/21/18 1035  WBC 7.7 5.7 5.3 5.9  --   HGB 8.8* 6.9* 7.9* 7.2* 7.7*  HCT 28.8* 23.2* 26.0* 24.0* 26.0*  MCV 87.3 89.6 88.7 91.3  --   PLT 238 177 221 171  --     Cardiac Enzymes: No results for input(s): CKTOTAL, CKMB, CKMBINDEX, TROPONINI in the last 168  hours.  BNP (last 3 results) No results for input(s): BNP in the last 8760 hours.  ProBNP (last 3 results) No results for input(s): PROBNP in the last 8760 hours.  Radiological Exams: No results found.  Assessment/Plan Principal Problem:   Acute on chronic respiratory failure with hypoxia (HCC) Active Problems:   MRSA pneumonia (HCC)   Gram-negative pneumonia (HCC)   Pleural effusion   Chronic atrial fibrillation (HCC)   1. Acute on chronic respiratory failure with hypoxia we will continue with T bar patient is back to his normal baseline. 2. MRSA pneumonia and gram-negative pneumonia treated with antibiotics we will continue to follow. 3. We will effusion monitor x-rays as needed 4. Chronic atrial fibrillation rate is controlled   I have personally seen and evaluated the patient, evaluated laboratory and imaging results, formulated the assessment and plan and placed orders. The Patient requires high complexity decision making for assessment and support.  Case was discussed on Rounds with the Respiratory Therapy Staff  Yevonne PaxSaadat A Norissa Bartee, MD [facility] Pulmonary Critical Care Medicine Sleep Medicine

## 2018-04-22 DIAGNOSIS — J156 Pneumonia due to other aerobic Gram-negative bacteria: Secondary | ICD-10-CM | POA: Diagnosis not present

## 2018-04-22 DIAGNOSIS — Z9889 Other specified postprocedural states: Secondary | ICD-10-CM | POA: Diagnosis not present

## 2018-04-22 DIAGNOSIS — I482 Chronic atrial fibrillation: Secondary | ICD-10-CM | POA: Diagnosis not present

## 2018-04-22 DIAGNOSIS — J9621 Acute and chronic respiratory failure with hypoxia: Secondary | ICD-10-CM | POA: Diagnosis not present

## 2018-04-22 LAB — CBC
HCT: 24.3 % — ABNORMAL LOW (ref 39.0–52.0)
Hemoglobin: 7.3 g/dL — ABNORMAL LOW (ref 13.0–17.0)
MCH: 27.1 pg (ref 26.0–34.0)
MCHC: 30 g/dL (ref 30.0–36.0)
MCV: 90.3 fL (ref 78.0–100.0)
PLATELETS: 198 10*3/uL (ref 150–400)
RBC: 2.69 MIL/uL — AB (ref 4.22–5.81)
RDW: 20.6 % — AB (ref 11.5–15.5)
WBC: 5.3 10*3/uL (ref 4.0–10.5)

## 2018-04-22 LAB — HEMOGLOBIN AND HEMATOCRIT, BLOOD
HCT: 26.1 % — ABNORMAL LOW (ref 39.0–52.0)
HCT: 24.3 % — ABNORMAL LOW (ref 39.0–52.0)
HEMOGLOBIN: 7.2 g/dL — AB (ref 13.0–17.0)
Hemoglobin: 7.7 g/dL — ABNORMAL LOW (ref 13.0–17.0)

## 2018-04-22 NOTE — Progress Notes (Signed)
[facility] [place]   PULMONARY SERVICE  PROGRESS NOTE  Date of Service: 04/22/2018  Chris PillingJohn H [name]  WUJ:[id number]  DOB: 1929/07/25   DOA: 04/06/2018  Referring Physician: Carron CurieAli Hijazi, MD  HPI: Chris PillingJohn H [name] is a 84 y.o. male seen for follow up of Acute on Chronic Respiratory Failure.  Doing well at this time on T collar has been on 28% FiO2  Medications: Reviewed on Rounds  Physical Exam:  Vitals: Temperature 97.6 pulse 80 respiratory rate 18 blood pressure 136/79 saturation 94%  Ventilator Settings off of the ventilator  . General: Comfortable at this time . Eyes: Grossly normal lids, irises & conjunctiva . ENT: grossly tongue is normal . Neck: no obvious mass . Cardiovascular: S1 S2 normal no gallop . Respiratory: Good air entry no rhonchi . Abdomen: soft . Skin: no rash seen on limited exam . Musculoskeletal: not rigid . Psychiatric:unable to assess . Neurologic: no seizure no involuntary movements         Lab Data:   Basic Metabolic Panel: Recent Labs  Lab 04/16/18 0841 04/18/18 0517 04/21/18 0515  NA 139 141 142  K 3.8 3.5 4.2  CL 103 108 107  CO2 24 27 26   GLUCOSE 77 107* 99  BUN 37* 38* 47*  CREATININE 0.80 0.88 1.10  CALCIUM 8.6* 8.5* 8.6*  MG 2.2 2.3 2.4  PHOS 2.7 3.4  --     Liver Function Tests: Recent Labs  Lab 04/16/18 0841 04/18/18 0517  ALBUMIN 2.6* 2.3*   No results for input(s): LIPASE, AMYLASE in the last 168 hours. No results for input(s): AMMONIA in the last 168 hours.  CBC: Recent Labs  Lab 04/16/18 0841 04/18/18 0517 04/19/18 0619 04/21/18 0515 04/21/18 1035 04/22/18 0455 04/22/18 1054  WBC 7.7 5.7 5.3 5.9  --  5.3  --   HGB 8.8* 6.9* 7.9* 7.2* 7.7* 7.3* 7.7*  HCT 28.8* 23.2* 26.0* 24.0* 26.0* 24.3* 26.1*  MCV 87.3 89.6 88.7 91.3  --  90.3  --   PLT 238 177 221 171  --  198  --     Cardiac Enzymes: No results for input(s): CKTOTAL, CKMB, CKMBINDEX, TROPONINI in the last  168 hours.  BNP (last 3 results) No results for input(s): BNP in the last 8760 hours.  ProBNP (last 3 results) No results for input(s): PROBNP in the last 8760 hours.  Radiological Exams: No results found.  Assessment/Plan Principal Problem:   Acute on chronic respiratory failure with hypoxia (HCC) Active Problems:   MRSA pneumonia (HCC)   Gram-negative pneumonia (HCC)   Pleural effusion   Chronic atrial fibrillation (HCC)   1. Acute on chronic respiratory failure with hypoxia we will continue with T collar baseline continue pulmonary toilet secretion management right now is comfortable. 2. MRSA pneumonia gram-negative pneumonia treated with antibiotics we will continue to follow 3. Pleural effusion follow x-ray as necessary 4. Chronic atrial fibrillation rate is controlled we will monitor   I have personally seen and evaluated the patient, evaluated laboratory and imaging results, formulated the assessment and plan and placed orders. The Patient requires high complexity decision making for assessment and support.  Case was discussed on Rounds with the Respiratory Therapy Staff  Yevonne PaxSaadat A Khan, MD [facility] Pulmonary Critical Care Medicine Sleep Medicine

## 2018-04-23 DIAGNOSIS — J156 Pneumonia due to other aerobic Gram-negative bacteria: Secondary | ICD-10-CM | POA: Diagnosis not present

## 2018-04-23 DIAGNOSIS — I482 Chronic atrial fibrillation: Secondary | ICD-10-CM | POA: Diagnosis not present

## 2018-04-23 DIAGNOSIS — J9621 Acute and chronic respiratory failure with hypoxia: Secondary | ICD-10-CM | POA: Diagnosis not present

## 2018-04-23 DIAGNOSIS — Z9889 Other specified postprocedural states: Secondary | ICD-10-CM | POA: Diagnosis not present

## 2018-04-23 LAB — RENAL FUNCTION PANEL
ALBUMIN: 2.4 g/dL — AB (ref 3.5–5.0)
Anion gap: 8 (ref 5–15)
BUN: 44 mg/dL — ABNORMAL HIGH (ref 6–20)
CALCIUM: 8.3 mg/dL — AB (ref 8.9–10.3)
CHLORIDE: 107 mmol/L (ref 101–111)
CO2: 23 mmol/L (ref 22–32)
Creatinine, Ser: 0.82 mg/dL (ref 0.61–1.24)
Glucose, Bld: 84 mg/dL (ref 65–99)
PHOSPHORUS: 3.3 mg/dL (ref 2.5–4.6)
Potassium: 4.7 mmol/L (ref 3.5–5.1)
SODIUM: 138 mmol/L (ref 135–145)

## 2018-04-23 LAB — CBC
HCT: 24.5 % — ABNORMAL LOW (ref 39.0–52.0)
Hemoglobin: 7.2 g/dL — ABNORMAL LOW (ref 13.0–17.0)
MCH: 27.2 pg (ref 26.0–34.0)
MCHC: 29.4 g/dL — ABNORMAL LOW (ref 30.0–36.0)
MCV: 92.5 fL (ref 78.0–100.0)
PLATELETS: 192 10*3/uL (ref 150–400)
RBC: 2.65 MIL/uL — AB (ref 4.22–5.81)
RDW: 20.1 % — ABNORMAL HIGH (ref 11.5–15.5)
WBC: 6.1 10*3/uL (ref 4.0–10.5)

## 2018-04-23 LAB — MAGNESIUM: MAGNESIUM: 2.2 mg/dL (ref 1.7–2.4)

## 2018-04-23 NOTE — Progress Notes (Signed)
[facility]   PULMONARY SERVICE  PROGRESS NOTE  Date of Service: 04/23/2018  Chris PillingJohn H [name]  ZOX:[id number]  DOB: 1929/04/02   DOA: 04/06/2018  Referring Physician: Carron CurieAli Hijazi, MD  HPI: Chris [name] is a 84 y.o. male seen for follow up of Acute on Chronic Respiratory Failure.  Remains at baseline on T collar currently 20% FiO2  Medications: Reviewed on Rounds  Physical Exam:  Vitals: Temperature 97.7 pulse 64 respiratory 18 blood pressure is 115/68 saturations 100%  Ventilator Settings on T collar trials at this time  . General: Comfortable at this time . Eyes: Grossly normal lids, irises & conjunctiva . ENT: grossly tongue is normal . Neck: no obvious mass . Cardiovascular: S1 S2 normal no gallop . Respiratory: No rhonchi is noted . Abdomen: soft . Skin: no rash seen on limited exam . Musculoskeletal: not rigid . Psychiatric:unable to assess . Neurologic: no seizure no involuntary movements         Lab Data:   Basic Metabolic Panel: Recent Labs  Lab 04/18/18 0517 04/21/18 0515 04/23/18 0910  NA 141 142 138  K 3.5 4.2 4.7  CL 108 107 107  CO2 27 26 23   GLUCOSE 107* 99 84  BUN 38* 47* 44*  CREATININE 0.88 1.10 0.82  CALCIUM 8.5* 8.6* 8.3*  MG 2.3 2.4 2.2  PHOS 3.4  --  3.3    Liver Function Tests: Recent Labs  Lab 04/18/18 0517 04/23/18 0910  ALBUMIN 2.3* 2.4*   No results for input(s): LIPASE, AMYLASE in the last 168 hours. No results for input(s): AMMONIA in the last 168 hours.  CBC: Recent Labs  Lab 04/18/18 0517 04/19/18 0619 04/21/18 0515 04/21/18 1035 04/22/18 0455 04/22/18 1054 04/22/18 1556 04/23/18 0910  WBC 5.7 5.3 5.9  --  5.3  --   --  6.1  HGB 6.9* 7.9* 7.2* 7.7* 7.3* 7.7* 7.2* 7.2*  HCT 23.2* 26.0* 24.0* 26.0* 24.3* 26.1* 24.3* 24.5*  MCV 89.6 88.7 91.3  --  90.3  --   --  92.5  PLT 177 221 171  --  198  --   --  192    Cardiac Enzymes: No results for input(s): CKTOTAL,  CKMB, CKMBINDEX, TROPONINI in the last 168 hours.  BNP (last 3 results) No results for input(s): BNP in the last 8760 hours.  ProBNP (last 3 results) No results for input(s): PROBNP in the last 8760 hours.  Radiological Exams: No results found.  Assessment/Plan Principal Problem:   Acute on chronic respiratory failure with hypoxia (HCC) Active Problems:   MRSA pneumonia (HCC)   Gram-negative pneumonia (HCC)   Pleural effusion   Chronic atrial fibrillation (HCC)   1. Acute on chronic respiratory failure with hypoxia patient is at baseline on T collar continue continue Foley toilet supportive care 2. MRSA pneumonia pneumonia treated we will continue to follow 3. Pleural effusion follow-up x-rays 4. Chronic atrial fibrillation rate is controlled   I have personally seen and evaluated the patient, evaluated laboratory and imaging results, formulated the assessment and plan and placed orders. The Patient requires high complexity decision making for assessment and support.  Case was discussed on Rounds with the Respiratory Therapy Staff  Yevonne PaxSaadat A Khan, MD [facility] Pulmonary Critical Care Medicine Sleep Medicine

## 2018-04-24 DIAGNOSIS — Z9889 Other specified postprocedural states: Secondary | ICD-10-CM | POA: Diagnosis not present

## 2018-04-24 DIAGNOSIS — I482 Chronic atrial fibrillation: Secondary | ICD-10-CM | POA: Diagnosis not present

## 2018-04-24 DIAGNOSIS — J156 Pneumonia due to other aerobic Gram-negative bacteria: Secondary | ICD-10-CM | POA: Diagnosis not present

## 2018-04-24 DIAGNOSIS — J9621 Acute and chronic respiratory failure with hypoxia: Secondary | ICD-10-CM | POA: Diagnosis not present

## 2018-04-24 LAB — CBC
HEMATOCRIT: 24.1 % — AB (ref 39.0–52.0)
Hemoglobin: 7.1 g/dL — ABNORMAL LOW (ref 13.0–17.0)
MCH: 27.1 pg (ref 26.0–34.0)
MCHC: 29.5 g/dL — AB (ref 30.0–36.0)
MCV: 92 fL (ref 78.0–100.0)
PLATELETS: 183 10*3/uL (ref 150–400)
RBC: 2.62 MIL/uL — ABNORMAL LOW (ref 4.22–5.81)
RDW: 19.5 % — ABNORMAL HIGH (ref 11.5–15.5)
WBC: 5.7 10*3/uL (ref 4.0–10.5)

## 2018-04-24 NOTE — Progress Notes (Signed)
[facility]   PULMONARY SERVICE  PROGRESS NOTE  Date of Service: 04/24/2018  Chris [name]  UJW:[id number]  DOB: 06/28/1929   DOA: 04/06/2018  Referring Physician: Carron CurieAli Hijazi, MD  HPI: Chris PillingJohn H [name] is a 84 y.o. male seen for follow up of Acute on Chronic Respiratory Failure.  Comfortable no distress remains on T collar currently  Medications: Reviewed on Rounds  Physical Exam:  Vitals: Temperature 97.5 pulse 60 respiratory rate 18 blood pressure 132/74 saturations 100%  Ventilator Settings off of the ventilator on T collar  . General: Comfortable at this time . Eyes: Grossly normal lids, irises & conjunctiva . ENT: grossly tongue is normal . Neck: no obvious mass . Cardiovascular: S1 S2 normal no gallop . Respiratory: No rhonchi . Abdomen: soft . Skin: no rash seen on limited exam . Musculoskeletal: not rigid . Psychiatric:unable to assess . Neurologic: no seizure no involuntary movements         Lab Data:   Basic Metabolic Panel: Recent Labs  Lab 04/18/18 0517 04/21/18 0515 04/23/18 0910  NA 141 142 138  K 3.5 4.2 4.7  CL 108 107 107  CO2 27 26 23   GLUCOSE 107* 99 84  BUN 38* 47* 44*  CREATININE 0.88 1.10 0.82  CALCIUM 8.5* 8.6* 8.3*  MG 2.3 2.4 2.2  PHOS 3.4  --  3.3    Liver Function Tests: Recent Labs  Lab 04/18/18 0517 04/23/18 0910  ALBUMIN 2.3* 2.4*   No results for input(s): LIPASE, AMYLASE in the last 168 hours. No results for input(s): AMMONIA in the last 168 hours.  CBC: Recent Labs  Lab 04/19/18 0619 04/21/18 0515  04/22/18 0455 04/22/18 1054 04/22/18 1556 04/23/18 0910 04/24/18 0837  WBC 5.3 5.9  --  5.3  --   --  6.1 5.7  HGB 7.9* 7.2*   < > 7.3* 7.7* 7.2* 7.2* 7.1*  HCT 26.0* 24.0*   < > 24.3* 26.1* 24.3* 24.5* 24.1*  MCV 88.7 91.3  --  90.3  --   --  92.5 92.0  PLT 221 171  --  198  --   --  192 183   < > = values in this interval not displayed.    Cardiac Enzymes: No  results for input(s): CKTOTAL, CKMB, CKMBINDEX, TROPONINI in the last 168 hours.  BNP (last 3 results) No results for input(s): BNP in the last 8760 hours.  ProBNP (last 3 results) No results for input(s): PROBNP in the last 8760 hours.  Radiological Exams: No results found.  Assessment/Plan Principal Problem:   Acute on chronic respiratory failure with hypoxia (HCC) Active Problems:   MRSA pneumonia (HCC)   Gram-negative pneumonia (HCC)   Pleural effusion   Chronic atrial fibrillation (HCC)   1. Acute on chronic respiratory failure with hypoxia we will continue with T collar trials patient's at baseline.  Continue with aggressive pulmonary toilet supportive care overall prognosis is guarded 2. MRSA pneumonia gram-negative pneumonia treated with antibiotics clinically improved 3. Chronic atrial fibrillation rate is controlled 4. Pleural effusion now at baseline we will follow along   I have personally seen and evaluated the patient, evaluated laboratory and imaging results, formulated the assessment and plan and placed orders. The Patient requires high complexity decision making for assessment and support.  Case was discussed on Rounds with the Respiratory Therapy Staff  Yevonne PaxSaadat A Kahlyn Shippey, MD [facility] Pulmonary Critical Care Medicine Sleep Medicine

## 2018-04-25 DIAGNOSIS — J156 Pneumonia due to other aerobic Gram-negative bacteria: Secondary | ICD-10-CM | POA: Diagnosis not present

## 2018-04-25 DIAGNOSIS — J9621 Acute and chronic respiratory failure with hypoxia: Secondary | ICD-10-CM | POA: Diagnosis not present

## 2018-04-25 DIAGNOSIS — I482 Chronic atrial fibrillation: Secondary | ICD-10-CM | POA: Diagnosis not present

## 2018-04-25 DIAGNOSIS — Z9889 Other specified postprocedural states: Secondary | ICD-10-CM | POA: Diagnosis not present

## 2018-04-25 LAB — CBC
HCT: 23.3 % — ABNORMAL LOW (ref 39.0–52.0)
HEMOGLOBIN: 6.9 g/dL — AB (ref 13.0–17.0)
MCH: 26.8 pg (ref 26.0–34.0)
MCHC: 29.6 g/dL — AB (ref 30.0–36.0)
MCV: 90.7 fL (ref 78.0–100.0)
PLATELETS: 215 10*3/uL (ref 150–400)
RBC: 2.57 MIL/uL — ABNORMAL LOW (ref 4.22–5.81)
RDW: 19.4 % — AB (ref 11.5–15.5)
WBC: 5 10*3/uL (ref 4.0–10.5)

## 2018-04-25 LAB — PREPARE RBC (CROSSMATCH)

## 2018-04-25 NOTE — Progress Notes (Signed)
Pulmonary Critical Care [facility]   PULMONARY SERVICE  PROGRESS NOTE  Date of Service: 04/25/2018  Chris PillingJohn H Walthour  GEX:[id number]  DOB: 06-15-1929   DOA: 04/06/2018  Referring Physician: Carron CurieAli Hijazi, MD  HPI: Chris PillingJohn H [name] is a 84 y.o. male seen for follow up of Acute on Chronic Respiratory Failure.  Comfortable no distress remains on T collar at baseline  Medications: Reviewed on Rounds  Physical Exam:  Vitals: Temperature 97.4 pulse 77 respiratory 21 blood pressure 136/67 saturations 100%  Ventilator Settings off the ventilator on T collar  . General: Comfortable at this time . Eyes: Grossly normal lids, irises & conjunctiva . ENT: grossly tongue is normal . Neck: no obvious mass . Cardiovascular: S1 S2 normal no gallop . Respiratory: Good air entry no rhonchi noted . Abdomen: soft . Skin: no rash seen on limited exam . Musculoskeletal: not rigid . Psychiatric:unable to assess . Neurologic: no seizure no involuntary movements         Lab Data:   Basic Metabolic Panel: Recent Labs  Lab 04/21/18 0515 04/23/18 0910  NA 142 138  K 4.2 4.7  CL 107 107  CO2 26 23  GLUCOSE 99 84  BUN 47* 44*  CREATININE 1.10 0.82  CALCIUM 8.6* 8.3*  MG 2.4 2.2  PHOS  --  3.3    Liver Function Tests: Recent Labs  Lab 04/23/18 0910  ALBUMIN 2.4*   No results for input(s): LIPASE, AMYLASE in the last 168 hours. No results for input(s): AMMONIA in the last 168 hours.  CBC: Recent Labs  Lab 04/21/18 0515  04/22/18 0455 04/22/18 1054 04/22/18 1556 04/23/18 0910 04/24/18 0837 04/25/18 0637  WBC 5.9  --  5.3  --   --  6.1 5.7 5.0  HGB 7.2*   < > 7.3* 7.7* 7.2* 7.2* 7.1* 6.9*  HCT 24.0*   < > 24.3* 26.1* 24.3* 24.5* 24.1* 23.3*  MCV 91.3  --  90.3  --   --  92.5 92.0 90.7  PLT 171  --  198  --   --  192 183 215   < > = values in this interval not displayed.    Cardiac Enzymes: No results for input(s): CKTOTAL, CKMB, CKMBINDEX, TROPONINI in  the last 168 hours.  BNP (last 3 results) No results for input(s): BNP in the last 8760 hours.  ProBNP (last 3 results) No results for input(s): PROBNP in the last 8760 hours.  Radiological Exams: No results found.  Assessment/Plan Principal Problem:   Acute on chronic respiratory failure with hypoxia (HCC) Active Problems:   MRSA pneumonia (HCC)   Gram-negative pneumonia (HCC)   Pleural effusion   Chronic atrial fibrillation (HCC)   1. Acute on chronic respiratory failure with hypoxia we will continue with weaning on T collar continue pulmonary toilet secretion management patient prognosis guarded. 2. MRSA pneumonia gram-negative pneumonia treated with antibiotics we will continue to follow 3. Pleural effusion stable we will monitor 4. Chronic atrial fibrillation rate is controlled we will follow   I have personally seen and evaluated the patient, evaluated laboratory and imaging results, formulated the assessment and plan and placed orders. The Patient requires high complexity decision making for assessment and support.  Case was discussed on Rounds with the Respiratory Therapy Staff  Yevonne PaxSaadat A Amaura Authier, MD [facility] Pulmonary Critical Care Medicine Sleep Medicine

## 2018-04-26 LAB — CBC
HCT: 26.6 % — ABNORMAL LOW (ref 39.0–52.0)
Hemoglobin: 8.2 g/dL — ABNORMAL LOW (ref 13.0–17.0)
MCH: 27.3 pg (ref 26.0–34.0)
MCHC: 30.8 g/dL (ref 30.0–36.0)
MCV: 88.7 fL (ref 78.0–100.0)
Platelets: 190 10*3/uL (ref 150–400)
RBC: 3 MIL/uL — AB (ref 4.22–5.81)
RDW: 19.6 % — ABNORMAL HIGH (ref 11.5–15.5)
WBC: 5.7 10*3/uL (ref 4.0–10.5)

## 2018-04-26 LAB — RENAL FUNCTION PANEL
Albumin: 2.5 g/dL — ABNORMAL LOW (ref 3.5–5.0)
Anion gap: 7 (ref 5–15)
BUN: 29 mg/dL — ABNORMAL HIGH (ref 6–20)
CALCIUM: 8.9 mg/dL (ref 8.9–10.3)
CO2: 25 mmol/L (ref 22–32)
CREATININE: 0.86 mg/dL (ref 0.61–1.24)
Chloride: 110 mmol/L (ref 101–111)
Glucose, Bld: 104 mg/dL — ABNORMAL HIGH (ref 65–99)
Phosphorus: 3.2 mg/dL (ref 2.5–4.6)
Potassium: 4.4 mmol/L (ref 3.5–5.1)
SODIUM: 142 mmol/L (ref 135–145)

## 2018-04-26 LAB — MAGNESIUM: MAGNESIUM: 1.9 mg/dL (ref 1.7–2.4)

## 2018-04-27 ENCOUNTER — Other Ambulatory Visit (HOSPITAL_COMMUNITY): Payer: Medicare HMO

## 2018-04-27 DIAGNOSIS — J15212 Pneumonia due to Methicillin resistant Staphylococcus aureus: Secondary | ICD-10-CM | POA: Diagnosis not present

## 2018-04-27 DIAGNOSIS — J9621 Acute and chronic respiratory failure with hypoxia: Secondary | ICD-10-CM | POA: Diagnosis not present

## 2018-04-27 DIAGNOSIS — J156 Pneumonia due to other aerobic Gram-negative bacteria: Secondary | ICD-10-CM | POA: Diagnosis not present

## 2018-04-27 DIAGNOSIS — I482 Chronic atrial fibrillation: Secondary | ICD-10-CM | POA: Diagnosis not present

## 2018-04-27 DIAGNOSIS — Z9889 Other specified postprocedural states: Secondary | ICD-10-CM | POA: Diagnosis not present

## 2018-04-27 LAB — CBC
HCT: 26.2 % — ABNORMAL LOW (ref 39.0–52.0)
Hemoglobin: 7.8 g/dL — ABNORMAL LOW (ref 13.0–17.0)
MCH: 27.1 pg (ref 26.0–34.0)
MCHC: 29.8 g/dL — AB (ref 30.0–36.0)
MCV: 91 fL (ref 78.0–100.0)
PLATELETS: 218 10*3/uL (ref 150–400)
RBC: 2.88 MIL/uL — ABNORMAL LOW (ref 4.22–5.81)
RDW: 19 % — AB (ref 11.5–15.5)
WBC: 5.7 10*3/uL (ref 4.0–10.5)

## 2018-04-27 MED ORDER — GENERIC EXTERNAL MEDICATION
Status: DC
Start: ? — End: 2018-04-27

## 2018-04-27 NOTE — Progress Notes (Signed)
Pulmonary Critical Care [facility]   PULMONARY SERVICE  PROGRESS NOTE  Date of Service: 04/27/2018  Chris [name]  ZOX:[id number]  DOB: 12-Aug-1929   DOA: 04/06/2018  Referring Physician: Carron CurieAli Hijazi, MD  HPI: Chris PillingJohn H [name] is a 84 y.o. male seen for follow up of Acute on Chronic Respiratory Failure.  Patient is doing fairly well has been on T collar discharge planning is still underway.  Now without distress comfortable  Medications: Reviewed on Rounds  Physical Exam:  Vitals: Temperature 97.6 pulse 76 respiratory rate 21 blood pressure 139/69 saturations 98%  Ventilator Settings off of the ventilator on T collar currently 28% FiO2  . General: Comfortable at this time . Eyes: Grossly normal lids, irises & conjunctiva . ENT: grossly tongue is normal . Neck: no obvious mass . Cardiovascular: S1 S2 normal no gallop . Respiratory: No rhonchi or rales are noted . Abdomen: soft . Skin: no rash seen on limited exam . Musculoskeletal: not rigid . Psychiatric:unable to assess . Neurologic: no seizure no involuntary movements         Lab Data:   Basic Metabolic Panel: Recent Labs  Lab 04/21/18 0515 04/23/18 0910 04/26/18 0914 04/26/18 0916  NA 142 138  --  142  K 4.2 4.7  --  4.4  CL 107 107  --  110  CO2 26 23  --  25  GLUCOSE 99 84  --  104*  BUN 47* 44*  --  29*  CREATININE 1.10 0.82  --  0.86  CALCIUM 8.6* 8.3*  --  8.9  MG 2.4 2.2 1.9  --   PHOS  --  3.3  --  3.2    Liver Function Tests: Recent Labs  Lab 04/23/18 0910 04/26/18 0916  ALBUMIN 2.4* 2.5*   No results for input(s): LIPASE, AMYLASE in the last 168 hours. No results for input(s): AMMONIA in the last 168 hours.  CBC: Recent Labs  Lab 04/23/18 0910 04/24/18 0837 04/25/18 0637 04/26/18 0914 04/27/18 1148  WBC 6.1 5.7 5.0 5.7 5.7  HGB 7.2* 7.1* 6.9* 8.2* 7.8*  HCT 24.5* 24.1* 23.3* 26.6* 26.2*  MCV 92.5 92.0 90.7 88.7 91.0  PLT 192 183 215 190 218    Cardiac  Enzymes: No results for input(s): CKTOTAL, CKMB, CKMBINDEX, TROPONINI in the last 168 hours.  BNP (last 3 results) No results for input(s): BNP in the last 8760 hours.  ProBNP (last 3 results) No results for input(s): PROBNP in the last 8760 hours.  Radiological Exams: No results found.  Assessment/Plan Principal Problem:   Acute on chronic respiratory failure with hypoxia (HCC) Active Problems:   MRSA pneumonia (HCC)   Gram-negative pneumonia (HCC)   Pleural effusion   Chronic atrial fibrillation (HCC)   1. Acute on chronic respiratory failure with hypoxia we will continue with T collar continue secretion management pulmonary toilet overall patient prognosis guarded. 2. MRSA pneumonia treated with antibiotics we will continue to follow. 3. Chronic atrial fibrillation rate is controlled continue to follow 4. Pleural effusion stable   I have personally seen and evaluated the patient, evaluated laboratory and imaging results, formulated the assessment and plan and placed orders. The Patient requires high complexity decision making for assessment and support.  Case was discussed on Rounds with the Respiratory Therapy Staff  Yevonne PaxSaadat A Rahmir Beever, MD [facility] Pulmonary Critical Care Medicine Sleep Medicine

## 2018-04-28 DIAGNOSIS — I482 Chronic atrial fibrillation: Secondary | ICD-10-CM | POA: Diagnosis not present

## 2018-04-28 DIAGNOSIS — Z9889 Other specified postprocedural states: Secondary | ICD-10-CM | POA: Diagnosis not present

## 2018-04-28 DIAGNOSIS — J9621 Acute and chronic respiratory failure with hypoxia: Secondary | ICD-10-CM | POA: Diagnosis not present

## 2018-04-28 DIAGNOSIS — J156 Pneumonia due to other aerobic Gram-negative bacteria: Secondary | ICD-10-CM | POA: Diagnosis not present

## 2018-04-28 LAB — CBC
HEMATOCRIT: 23.6 % — AB (ref 39.0–52.0)
HEMOGLOBIN: 7.1 g/dL — AB (ref 13.0–17.0)
MCH: 26.8 pg (ref 26.0–34.0)
MCHC: 30.1 g/dL (ref 30.0–36.0)
MCV: 89.1 fL (ref 78.0–100.0)
Platelets: UNDETERMINED 10*3/uL (ref 150–400)
RBC: 2.65 MIL/uL — ABNORMAL LOW (ref 4.22–5.81)
RDW: 18.9 % — ABNORMAL HIGH (ref 11.5–15.5)
WBC: 4.9 10*3/uL (ref 4.0–10.5)

## 2018-04-28 LAB — PREPARE RBC (CROSSMATCH)

## 2018-04-28 NOTE — Progress Notes (Signed)
[facility]ulmonary Critical [facility]   PULMONARY SERVICE  PROGRESS NOTE  Date of Service: 04/28/2018  Chris PillingJohn H [name]  ZOX:[id number]  DOB: 1929-05-06   DOA: 04/06/2018  Referring Physician: Carron CurieAli Hijazi, MD  HPI: Chris PillingJohn H [name] is a 84 y.o. male seen for follow up of Acute on Chronic Respiratory Failure.  Remains on T collar right now comfortable without distress.  Patient's been on 20% oxygen  Medications: Reviewed on Rounds  Physical Exam:  Vitals: Temperature 96.4 pulse 80 respiratory rate 26 blood pressure 129/65 saturations 98%  Ventilator Settings off of the ventilator  . General: Comfortable at this time . Eyes: Grossly normal lids, irises & conjunctiva . ENT: grossly tongue is normal . Neck: no obvious mass . Cardiovascular: S1 S2 normal no gallop . Respiratory: No rhonchi or rales . Abdomen: soft . Skin: no rash seen on limited exam . Musculoskeletal: not rigid . Psychiatric:unable to assess . Neurologic: no seizure no involuntary movements         Lab Data:   Basic Metabolic Panel: Recent Labs  Lab 04/23/18 0910 04/26/18 0914 04/26/18 0916  NA 138  --  142  K 4.7  --  4.4  CL 107  --  110  CO2 23  --  25  GLUCOSE 84  --  104*  BUN 44*  --  29*  CREATININE 0.82  --  0.86  CALCIUM 8.3*  --  8.9  MG 2.2 1.9  --   PHOS 3.3  --  3.2    Liver Function Tests: Recent Labs  Lab 04/23/18 0910 04/26/18 0916  ALBUMIN 2.4* 2.5*   No results for input(s): LIPASE, AMYLASE in the last 168 hours. No results for input(s): AMMONIA in the last 168 hours.  CBC: Recent Labs  Lab 04/24/18 0837 04/25/18 0637 04/26/18 0914 04/27/18 1148 04/28/18 0612  WBC 5.7 5.0 5.7 5.7 4.9  HGB 7.1* 6.9* 8.2* 7.8* 7.1*  HCT 24.1* 23.3* 26.6* 26.2* 23.6*  MCV 92.0 90.7 88.7 91.0 89.1  PLT 183 215 190 218 PLATELET CLUMPS NOTED ON SMEAR, UNABLE TO ESTIMATE    Cardiac Enzymes: No results for input(s): CKTOTAL, CKMB, CKMBINDEX, TROPONINI in the last  168 hours.  BNP (last 3 results) No results for input(s): BNP in the last 8760 hours.  ProBNP (last 3 results) No results for input(s): PROBNP in the last 8760 hours.  Radiological Exams: Dg Chest Port 1 View  Result Date: 04/27/2018 CLINICAL DATA:  Respiratory failure EXAM: PORTABLE CHEST 1 VIEW COMPARISON:  04/18/2018 FINDINGS: Stable cardiac enlargement. Aortic atherosclerosis noted. The tracheostomy tube tip is above the carina. Small right pleural effusion is new from the previous exam. Bilateral lower lobe airspace opacities have increased in the interval. IMPRESSION: 1. New right pleural effusion. 2. Worsening aeration of both lung bases. 3.  Aortic Atherosclerosis (ICD10-I70.0). Electronically Signed   By: Signa Kellaylor  Stroud M.D.   On: 04/27/2018 17:22    Assessment/Plan Principal Problem:   Acute on chronic respiratory failure with hypoxia (HCC) Active Problems:   MRSA pneumonia (HCC)   Gram-negative pneumonia (HCC)   Pleural effusion   Chronic atrial fibrillation (HCC)   1. Acute on chronic respiratory failure with hypoxia we will continue with T collar continue pulmonary toilet supportive care patient seems to be doing well at this time awaiting discharge planning. 2. MRSA pneumonia treated with antibiotics we will continue with supportive care. 3. Pleural effusion stable monitor x-rays patient has a right pleural effusion noted  on the last film 4. Chronic atrial fibrillation rate is controlled at this time.   I have personally seen and evaluated the patient, evaluated laboratory and imaging results, formulated the assessment and plan and placed orders. The Patient requires high complexity decision making for assessment and support.  Case was discussed on Rounds with the Respiratory Therapy Staff  Yevonne Pax, MD [facility] Pulmonary Critical Care Medicine Sleep Medicine

## 2018-04-29 DIAGNOSIS — I482 Chronic atrial fibrillation: Secondary | ICD-10-CM | POA: Diagnosis not present

## 2018-04-29 DIAGNOSIS — J9621 Acute and chronic respiratory failure with hypoxia: Secondary | ICD-10-CM | POA: Diagnosis not present

## 2018-04-29 DIAGNOSIS — J156 Pneumonia due to other aerobic Gram-negative bacteria: Secondary | ICD-10-CM | POA: Diagnosis not present

## 2018-04-29 DIAGNOSIS — Z9889 Other specified postprocedural states: Secondary | ICD-10-CM | POA: Diagnosis not present

## 2018-04-29 LAB — TYPE AND SCREEN
ABO/RH(D): B POS
ANTIBODY SCREEN: NEGATIVE
UNIT DIVISION: 0
UNIT DIVISION: 0
Unit division: 0

## 2018-04-29 LAB — RENAL FUNCTION PANEL
ALBUMIN: 2.2 g/dL — AB (ref 3.5–5.0)
ANION GAP: 4 — AB (ref 5–15)
BUN: 37 mg/dL — AB (ref 6–20)
CALCIUM: 8.7 mg/dL — AB (ref 8.9–10.3)
CO2: 27 mmol/L (ref 22–32)
CREATININE: 0.8 mg/dL (ref 0.61–1.24)
Chloride: 110 mmol/L (ref 101–111)
GFR calc Af Amer: >60 mL/min (ref >60)
GFR calc non Af Amer: >60 mL/min (ref >60)
GLUCOSE: 96 mg/dL (ref 65–99)
PHOSPHORUS: 3.2 mg/dL (ref 2.5–4.6)
Potassium: 4.1 mmol/L (ref 3.5–5.1)
SODIUM: 141 mmol/L (ref 135–145)

## 2018-04-29 LAB — BPAM RBC
BLOOD PRODUCT EXPIRATION DATE: 201907052359
BLOOD PRODUCT EXPIRATION DATE: 201907062359
Blood Product Expiration Date: 201907052359
ISSUE DATE / TIME: 201906131257
ISSUE DATE / TIME: 201906101451
ISSUE DATE / TIME: 201906131759
UNIT TYPE AND RH: 7300
UNIT TYPE AND RH: 7300
UNIT TYPE AND RH: 7300

## 2018-04-29 LAB — CBC
HEMATOCRIT: 26 % — AB (ref 39.0–52.0)
Hemoglobin: 8 g/dL — ABNORMAL LOW (ref 13.0–17.0)
MCH: 27.4 pg (ref 26.0–34.0)
MCHC: 30.8 g/dL (ref 30.0–36.0)
MCV: 89 fL (ref 78.0–100.0)
PLATELETS: 257 10*3/uL (ref 150–400)
RBC: 2.92 MIL/uL — ABNORMAL LOW (ref 4.22–5.81)
RDW: 17.6 % — AB (ref 11.5–15.5)
WBC: 5 10*3/uL (ref 4.0–10.5)

## 2018-04-29 LAB — MAGNESIUM: MAGNESIUM: 2.1 mg/dL (ref 1.7–2.4)

## 2018-04-29 NOTE — Progress Notes (Signed)
Pulmonary Critical Care Medicine [facility] [place]   PULMONARY SERVICE  PROGRESS NOTE  Date of Service: 04/29/2018  Chris [name]  UJW:[id number]  DOB: Mar 26, 1929   DOA: 04/06/2018  Referring Physician: Carron Curie, MD  HPI: Chris [name] is a 84 y.o. male seen for follow up of Acute on Chronic Respiratory Failure.  Comfortable no distress patient is on T collar  Medications: Reviewed on Rounds  Physical Exam:  Vitals: Temperature 97.1 pulse 58 respiratory 18 blood pressure 148/68 saturations 99%  Ventilator Settings off the ventilator on T collar  . General: Comfortable at this time . Eyes: Grossly normal lids, irises & conjunctiva . ENT: grossly tongue is normal . Neck: no obvious mass . Cardiovascular: S1 S2 normal no gallop . Respiratory: Good aeration no rhonchi . Abdomen: soft . Skin: no rash seen on limited exam . Musculoskeletal: not rigid . Psychiatric:unable to assess . Neurologic: no seizure no involuntary movements         Lab Data:   Basic Metabolic Panel: Recent Labs  Lab 04/23/18 0910 04/26/18 0914 04/26/18 0916 04/29/18 0646  NA 138  --  142 141  K 4.7  --  4.4 4.1  CL 107  --  110 110  CO2 23  --  25 27  GLUCOSE 84  --  104* 96  BUN 44*  --  29* 37*  CREATININE 0.82  --  0.86 0.80  CALCIUM 8.3*  --  8.9 8.7*  MG 2.2 1.9  --  2.1  PHOS 3.3  --  3.2 3.2    Liver Function Tests: Recent Labs  Lab 04/23/18 0910 04/26/18 0916 04/29/18 0646  ALBUMIN 2.4* 2.5* 2.2*   No results for input(s): LIPASE, AMYLASE in the last 168 hours. No results for input(s): AMMONIA in the last 168 hours.  CBC: Recent Labs  Lab 04/25/18 0637 04/26/18 0914 04/27/18 1148 04/28/18 0612 04/29/18 0646  WBC 5.0 5.7 5.7 4.9 5.0  HGB 6.9* 8.2* 7.8* 7.1* 8.0*  HCT 23.3* 26.6* 26.2* 23.6* 26.0*  MCV 90.7 88.7 91.0 89.1 89.0  PLT 215 190 218 PLATELET CLUMPS NOTED ON SMEAR, UNABLE TO ESTIMATE 257    Cardiac Enzymes: No results for input(s):  CKTOTAL, CKMB, CKMBINDEX, TROPONINI in the last 168 hours.  BNP (last 3 results) No results for input(s): BNP in the last 8760 hours.  ProBNP (last 3 results) No results for input(s): PROBNP in the last 8760 hours.  Radiological Exams: Dg Chest Port 1 View  Result Date: 04/27/2018 CLINICAL DATA:  Respiratory failure EXAM: PORTABLE CHEST 1 VIEW COMPARISON:  04/18/2018 FINDINGS: Stable cardiac enlargement. Aortic atherosclerosis noted. The tracheostomy tube tip is above the carina. Small right pleural effusion is new from the previous exam. Bilateral lower lobe airspace opacities have increased in the interval. IMPRESSION: 1. New right pleural effusion. 2. Worsening aeration of both lung bases. 3.  Aortic Atherosclerosis (ICD10-I70.0). Electronically Signed   By: Signa Kell M.D.   On: 04/27/2018 17:22    Assessment/Plan Principal Problem:   Acute on chronic respiratory failure with hypoxia (HCC) Active Problems:   MRSA pneumonia (HCC)   Gram-negative pneumonia (HCC)   Pleural effusion   Chronic atrial fibrillation (HCC)   1. Acute on chronic respiratory failure with hypoxia we will continue with T collar as tolerated continue pulmonary toilet supportive care prognosis guarded. 2. MRSA pneumonia gram-negative pneumonia treated with antibiotics we will follow clinically improved chest x-ray results were reviewed and discussed with primary care 3.  Pleural effusion we will follow along 4. Chronic atrial fibrillation rate is controlled   I have personally seen and evaluated the patient, evaluated laboratory and imaging results, formulated the assessment and plan and placed orders. The Patient requires high complexity decision making for assessment and support.  Case was discussed on Rounds with the Respiratory Therapy Staff  Yevonne PaxSaadat A Avan Gullett, MD [facility] Pulmonary Critical Care Medicine Sleep Medicine

## 2018-05-14 DIAGNOSIS — E039 Hypothyroidism, unspecified: Secondary | ICD-10-CM

## 2018-05-14 DIAGNOSIS — Z430 Encounter for attention to tracheostomy: Secondary | ICD-10-CM

## 2018-05-14 DIAGNOSIS — I10 Essential (primary) hypertension: Secondary | ICD-10-CM | POA: Diagnosis not present

## 2018-05-14 DIAGNOSIS — D649 Anemia, unspecified: Secondary | ICD-10-CM | POA: Diagnosis not present

## 2018-05-15 DIAGNOSIS — D649 Anemia, unspecified: Secondary | ICD-10-CM | POA: Diagnosis not present

## 2018-05-15 DIAGNOSIS — I10 Essential (primary) hypertension: Secondary | ICD-10-CM | POA: Diagnosis not present

## 2018-05-15 DIAGNOSIS — E119 Type 2 diabetes mellitus without complications: Secondary | ICD-10-CM

## 2018-05-15 DIAGNOSIS — E039 Hypothyroidism, unspecified: Secondary | ICD-10-CM | POA: Diagnosis not present

## 2018-05-15 DIAGNOSIS — Z430 Encounter for attention to tracheostomy: Secondary | ICD-10-CM | POA: Diagnosis not present

## 2019-04-18 IMAGING — CT CT CHEST W/ CM
2 of 4 series · 15 of 36 positions shown, 18 images · IV contrast (omnipaque)
Comparison: Radiography 04/07/2018

CLINICAL DATA: Respiratory failure, shortness of breath

EXAM:
CT CHEST WITH CONTRAST
TECHNIQUE: Multidetector CT imaging of the chest was performed during
intravenous contrast administration.
CONTRAST:  75mL OMNIPAQUE IOHEXOL 300 MG/ML  SOLN

[Series 3: chest w · axial · 0.75mm/px · z∈[+1172,+1408]mm · 12 of 140 slices shown, 15 images]
[im 11/140  mediastinal]
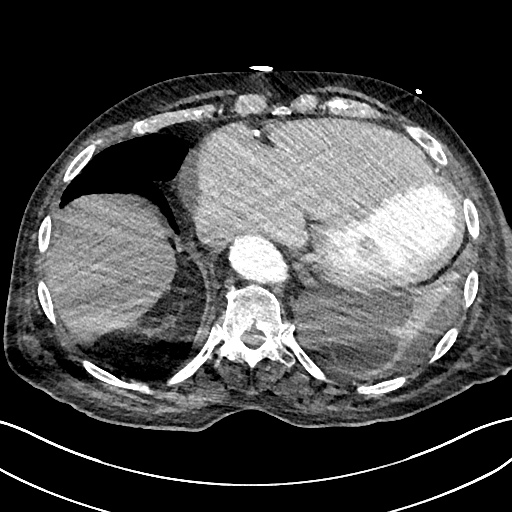
[im 11/140  lung]
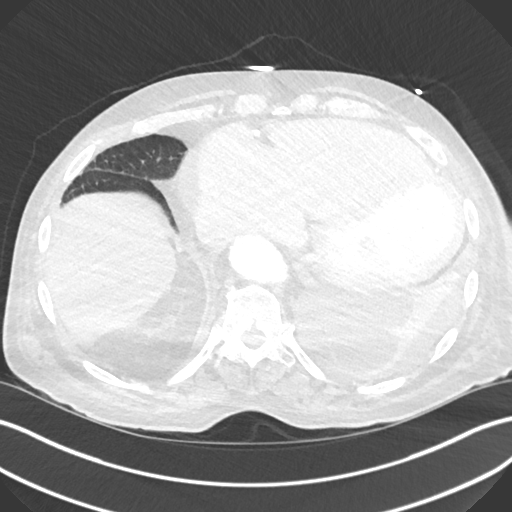
[im 22/140  lung]
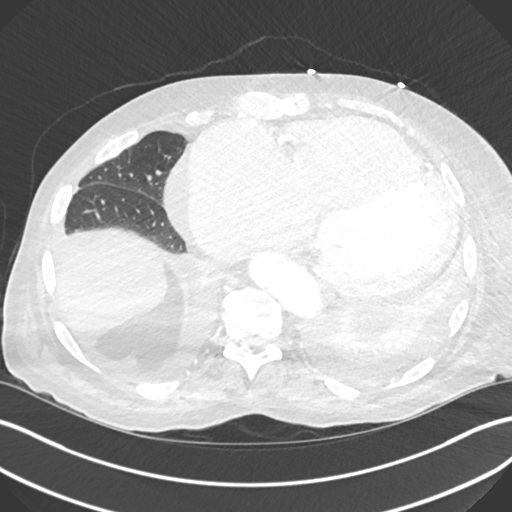
[im 33/140  lung]
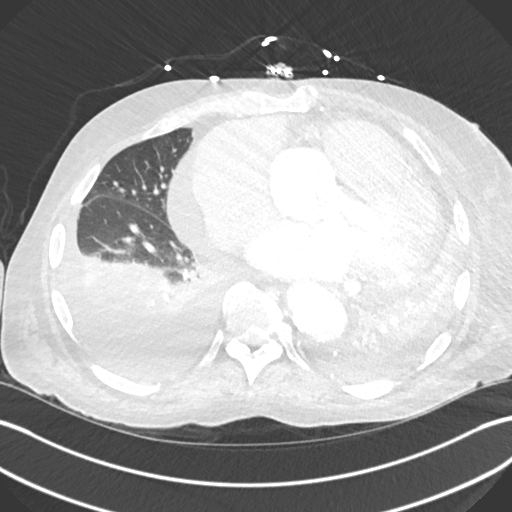
[im 43/140  lung]
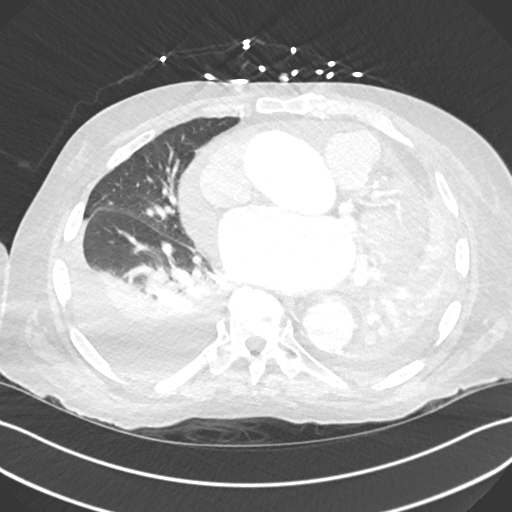
[im 54/140  mediastinal]
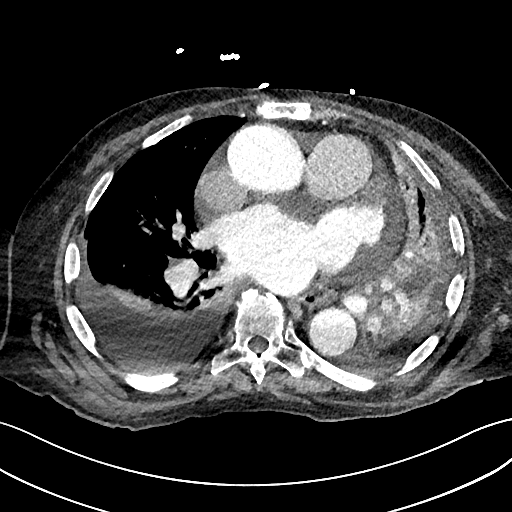
[im 54/140  lung]
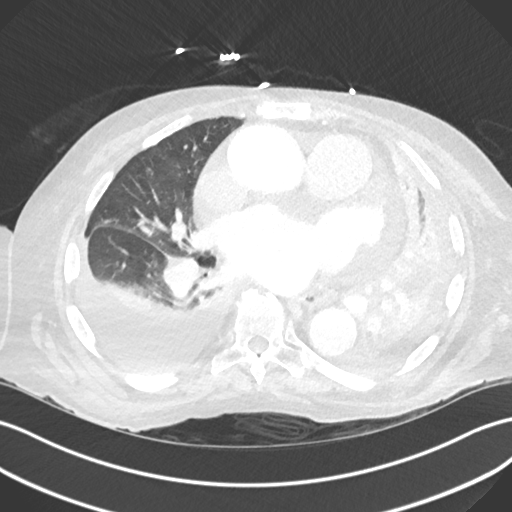
[im 65/140  lung]
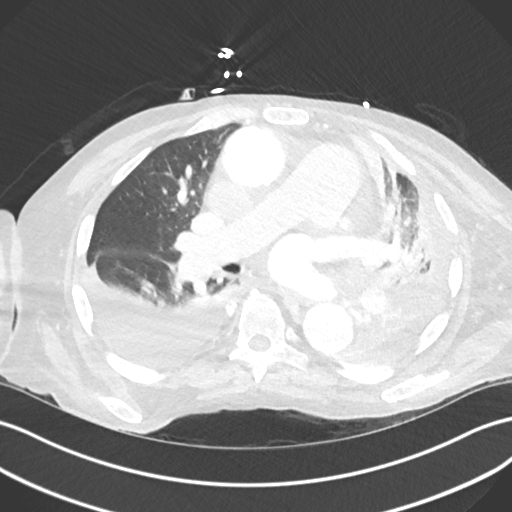
[im 75/140  lung]
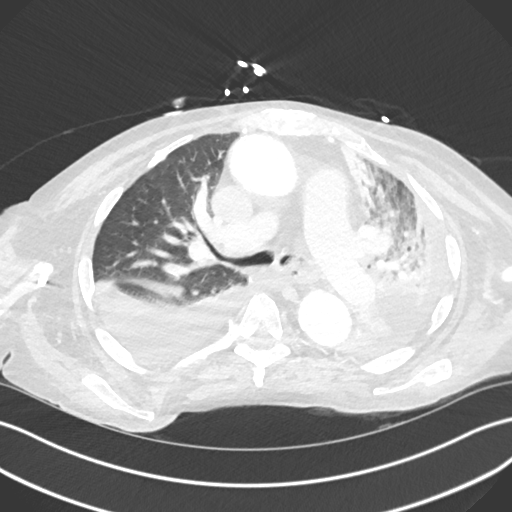
[im 86/140  lung]
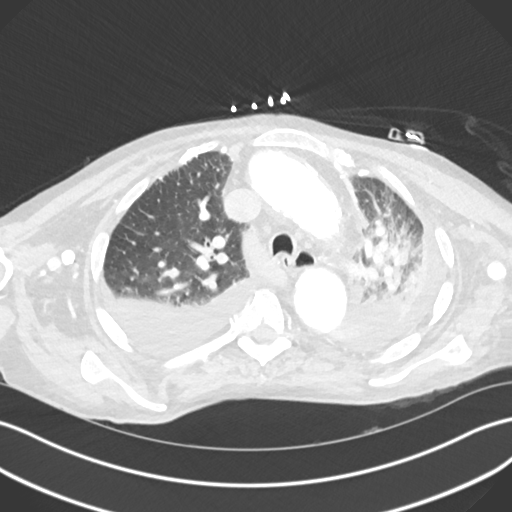
[im 97/140  mediastinal]
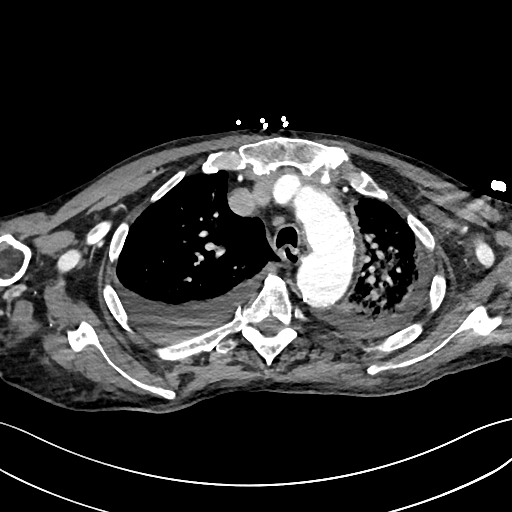
[im 97/140  lung]
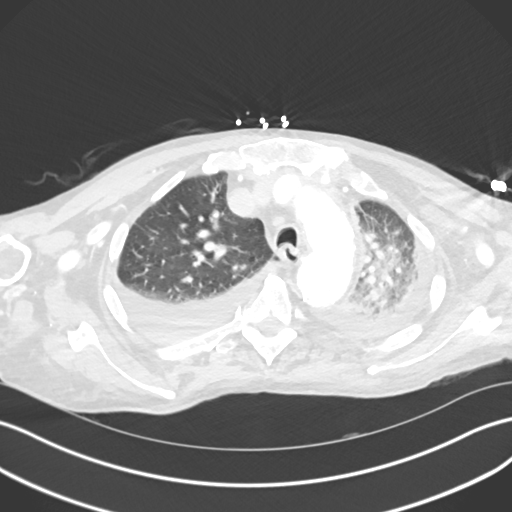
[im 107/140  lung]
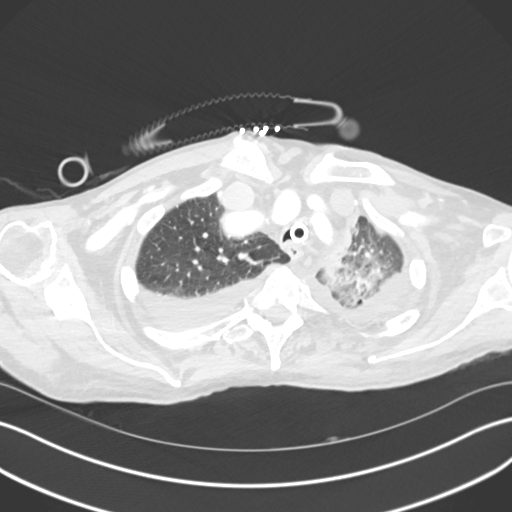
[im 118/140  lung]
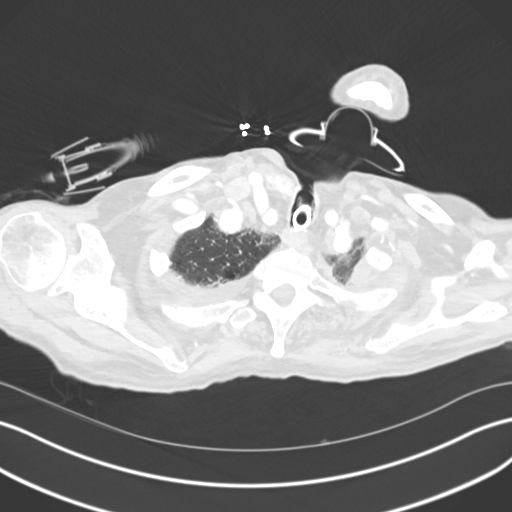
[im 129/140  lung]
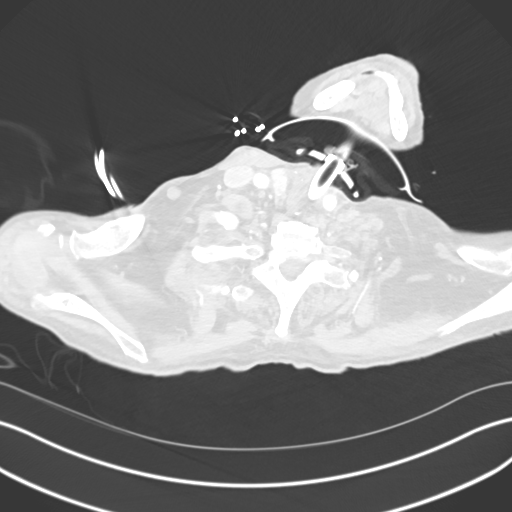

[Series 6: cor · coronal · 0.71mm/px · 3 of 118 slices shown]
[im 24/118  lung]
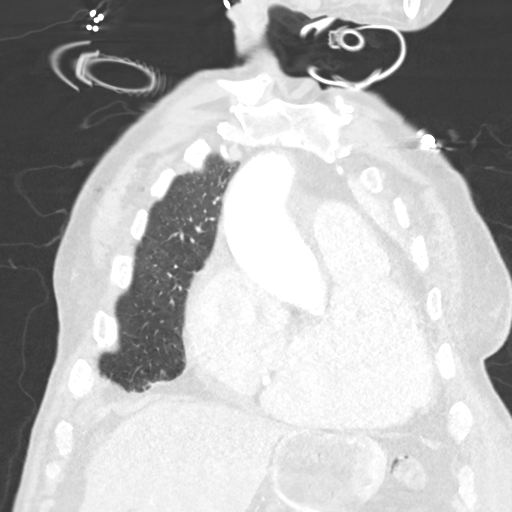
[im 47/118  lung]
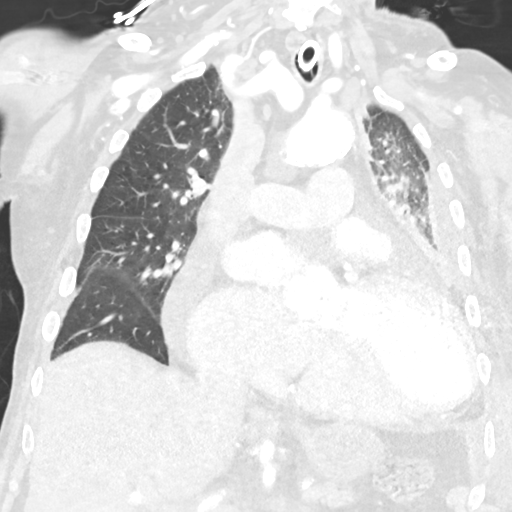
[im 71/118  lung]
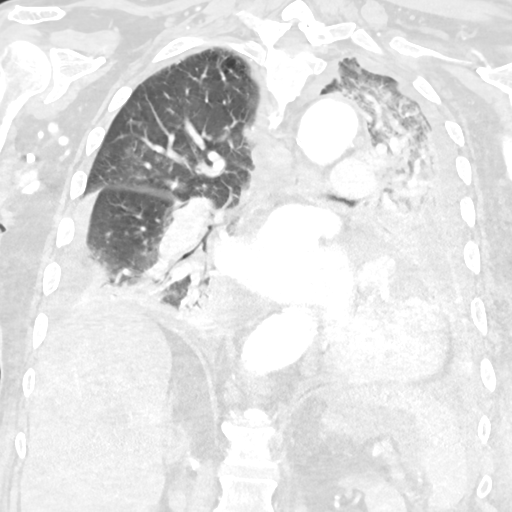

[15 of 36 positions shown; findings below may reference images not displayed]

FINDINGS: Cardiovascular: Four-chamber cardiac enlargement. Small pericardial
effusion. Dilated central pulmonary arteries. Coronary
calcifications. Aortic leaflet calcifications. Dilated aortic root
up to 5.1 cm diameter. Scattered calcified atheromatous plaque
through the thoracic aorta.

Mediastinum/Nodes: Tracheostomy projects in expected location. No
hilar or mediastinal adenopathy.

Lungs/Pleura: Moderate bilateral pleural effusions. Associated
dependent atelectasis in the right lower lobe. More extensive
consolidation throughout much of the left lung with relative sparing
of the apex.

Upper Abdomen: No acute findings

Musculoskeletal: Anterior vertebral endplate spurring at multiple
levels in the mid and lower thoracic spine. Healing sternal
fracture. No acute fracture or worrisome bone lesion.
IMPRESSION: 1. Extensive airspace consolidation throughout the left lung.
2. Moderate bilateral pleural effusions.
3. Dilated central pulmonary arteries suggesting pulmonary arterial
hypertension.
4. Coronary and aortic leaflet calcifications. Correlate with any
echocardiographic evidence of aortic stenosis.
5. 4.1 cm ascending thoracic aortic aneurysm. Recommend semi-annual
imaging followup by CTA or MRA and referral to cardiothoracic
surgery if not already obtained. This recommendation follows 7454
ACCF/AHA/AATS/ACR/ASA/SCA/QAMAR/MD MD/MC INTOSH BENJAMIN/FARKHANDA Guidelines for the
Diagnosis and Management of Patients With Thoracic Aortic Disease.
Circulation. 7454; 121: [id number]

## 2019-04-25 IMAGING — DX DG CHEST 1V PORT
1 series · 1 of 1 positions shown · non-contrast
Comparison: Single-view of the chest 04/13/2017 and 04/07/2018. CT
chest 04/09/2018.

CLINICAL DATA: Respiratory failure.  Pleural effusion.

EXAM:
PORTABLE CHEST 1 VIEW

[chest ap]
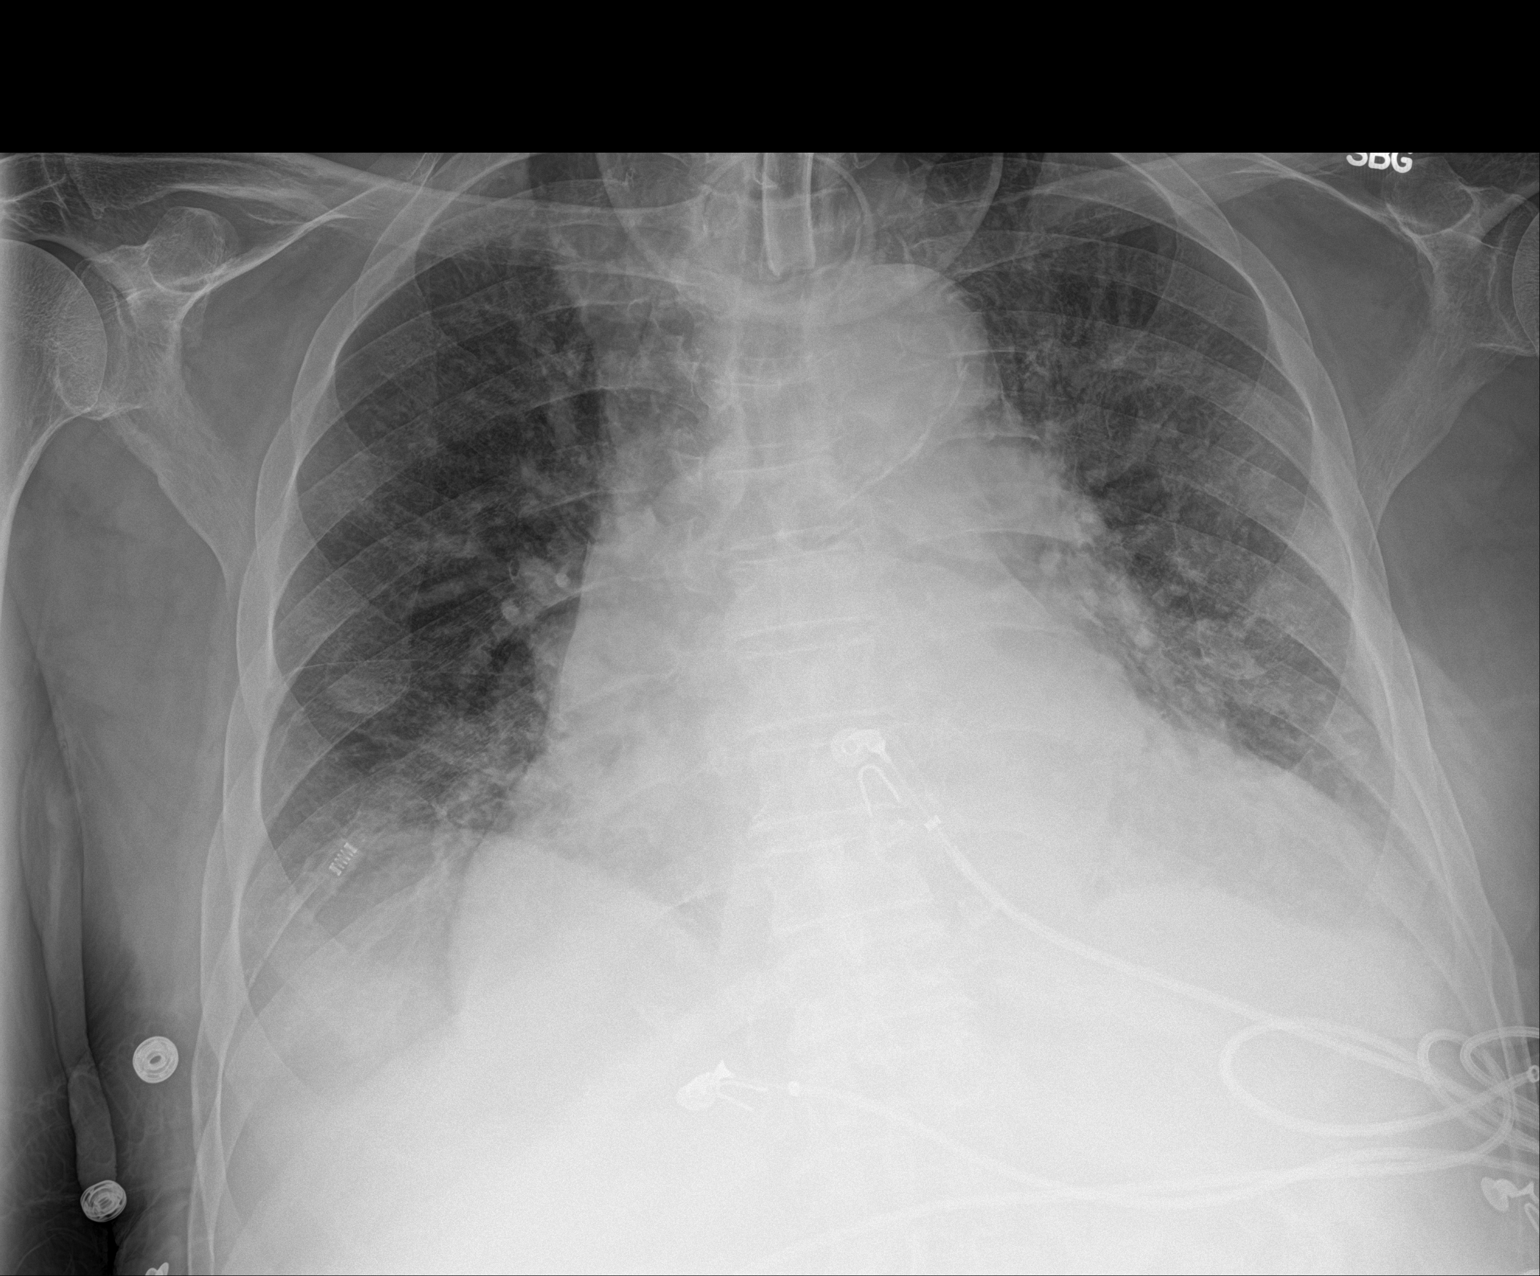

[1 of 1 positions shown; findings below may reference images not displayed]

FINDINGS: Tracheostomy tube remains in place. Aeration in the left chest is
markedly improved since yesterday's examination. There is mild right
basilar atelectasis. Cardiomegaly is seen. Aortic atherosclerosis
noted. No pneumothorax.
IMPRESSION: Marked improvement in aeration in the left chest. No new
abnormality.

## 2019-04-27 IMAGING — DX DG CHEST 1V PORT
1 series · 1 of 1 positions shown · non-contrast
Comparison: Portable chest x-ray April 16, 2018

CLINICAL DATA: Acute on chronic respiratory failure, pneumonia,
chronic atrial fibrillation, tracheostomy patient.

EXAM:
PORTABLE CHEST 1 VIEW

[chest ap]
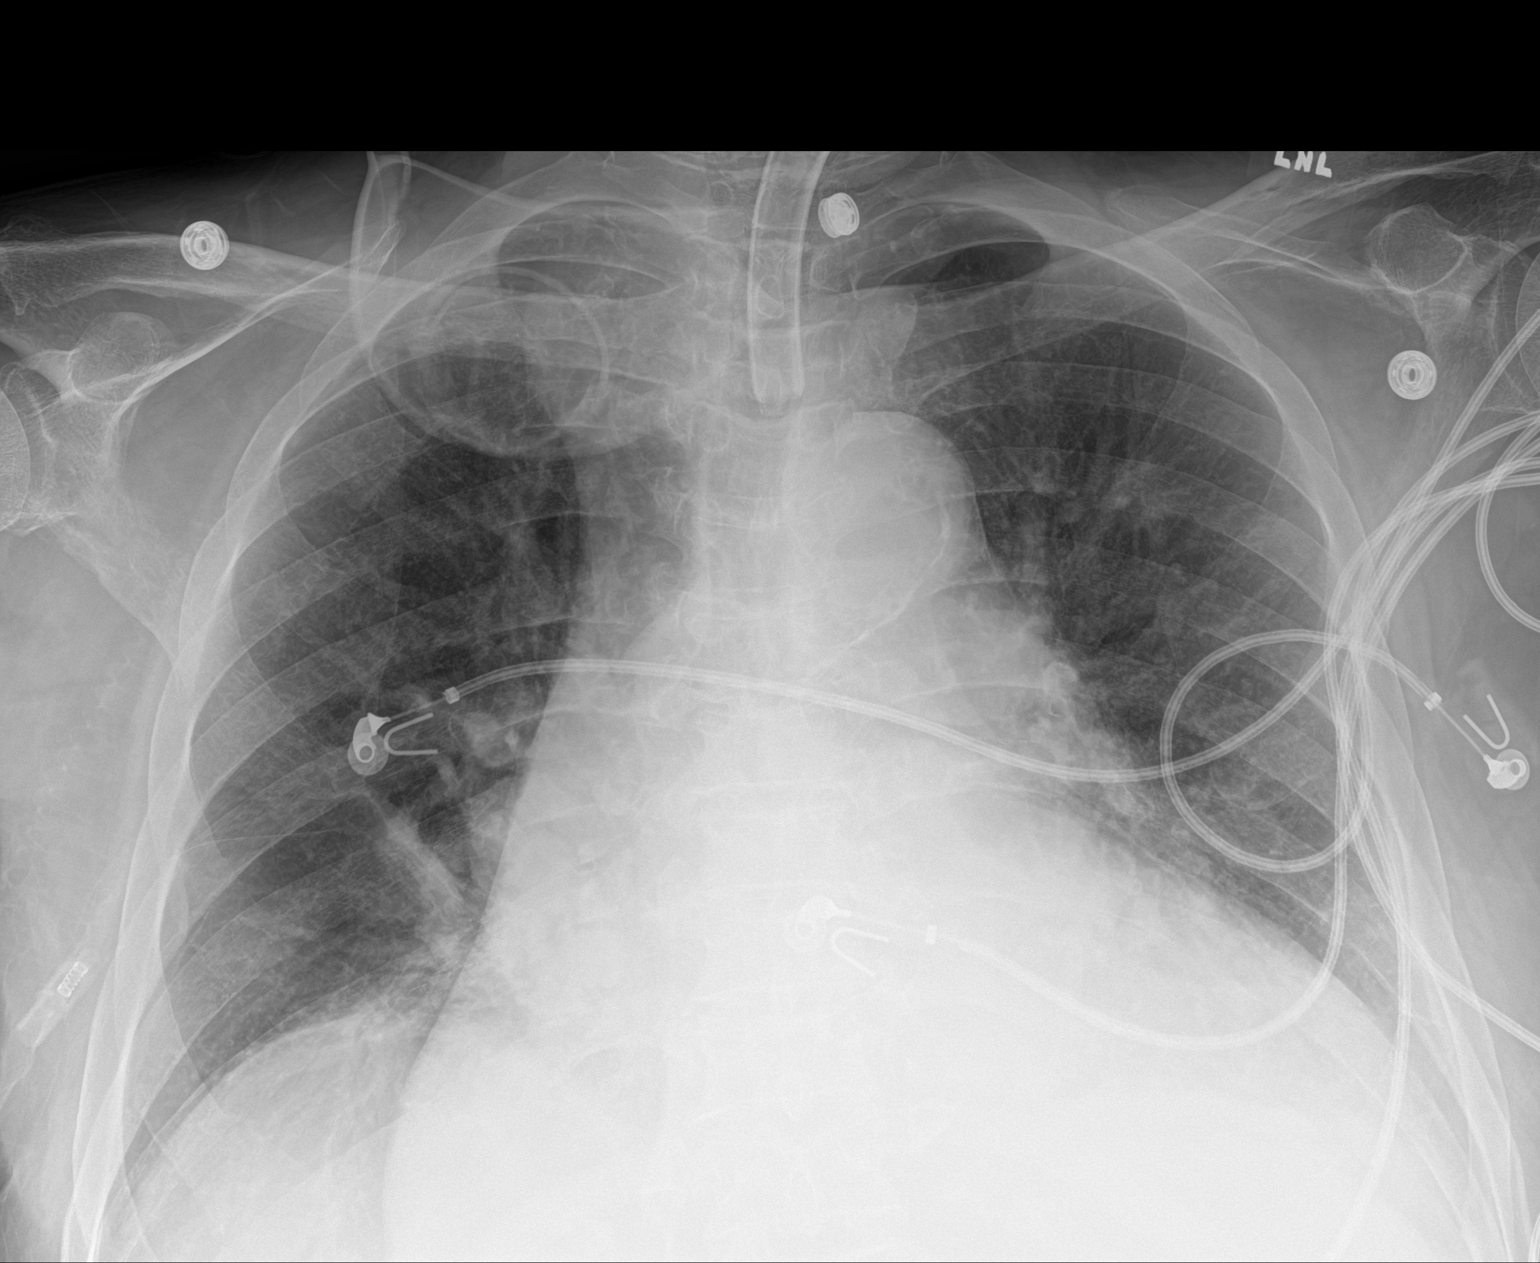

[1 of 1 positions shown; findings below may reference images not displayed]

FINDINGS: The lungs are adequately inflated. There are patchy increased
densities at the lung bases greatest on the right. However, overall
the appearance of the lungs has improved. The cardiac silhouette
remains enlarged. The pulmonary vascularity is not engorged. There
is calcification in the wall of the aortic arch. The tracheostomy
tube tip projects at the inferior margin of the clavicular heads.
The bony thorax exhibits no acute abnormality.
IMPRESSION: Bibasilar atelectasis, improving. Decreased pulmonary interstitial
edema. Stable enlargement of the cardiac silhouette.

Thoracic aortic atherosclerosis.

## 2019-08-12 IMAGING — US US CHEST/MEDIASTINUM
1 series · 13 of 13 positions shown · non-contrast
Comparison: Radiograph April 13, 2018.

CLINICAL DATA: Pleural effusion.

EXAM:
CHEST ULTRASOUND

[Series 1: us chest/mediastinum · 0.23mm/px · 13 of 13 slices shown]
[im 1/13]
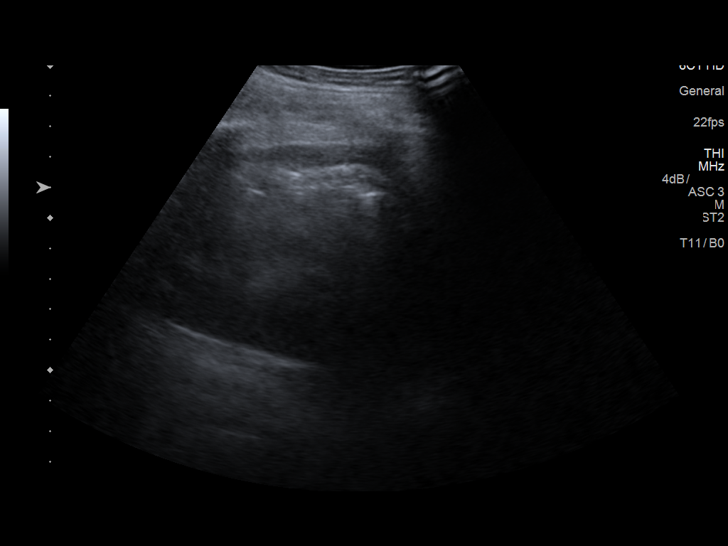
[im 2/13]
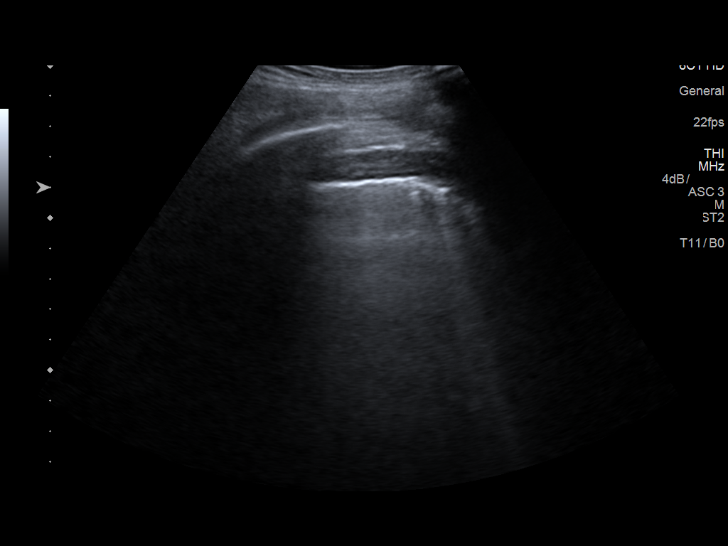
[im 3/13]
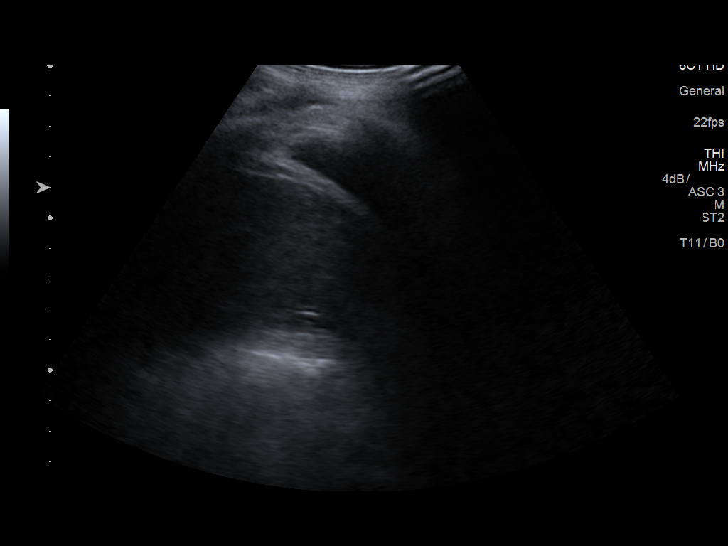
[im 4/13]
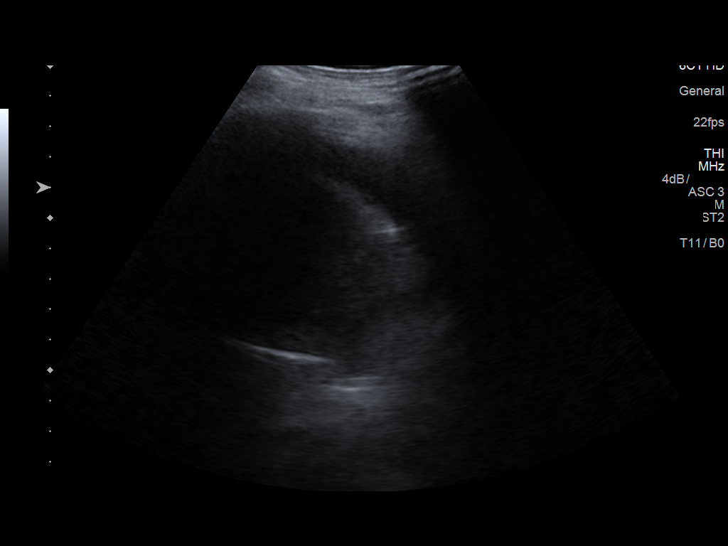
[im 5/13]
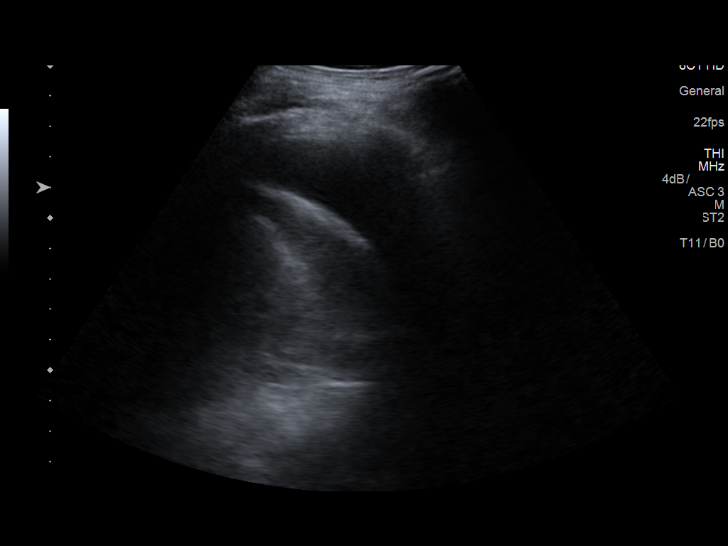
[im 6/13]
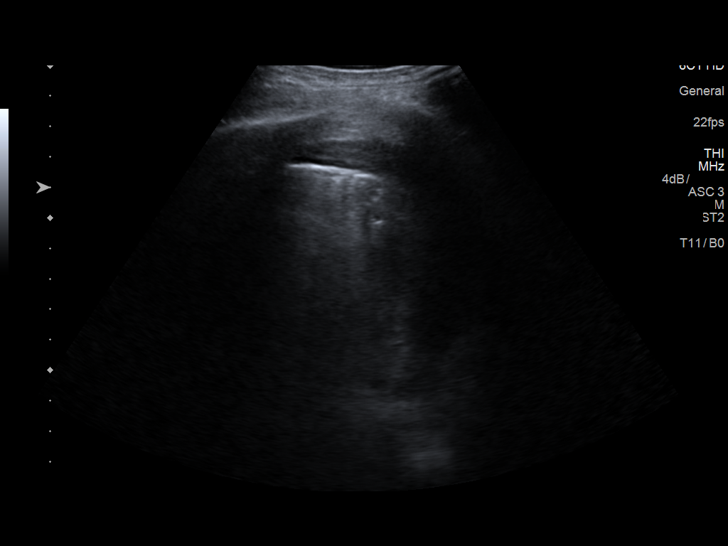
[im 7/13]
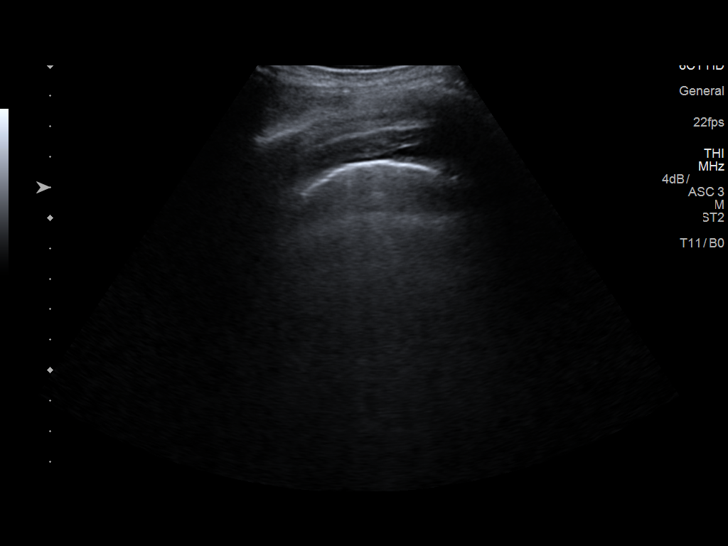
[im 8/13]
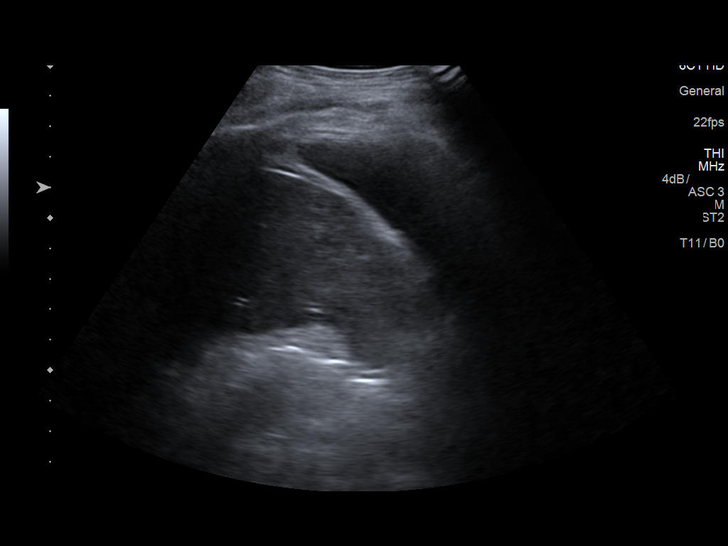
[im 9/13]
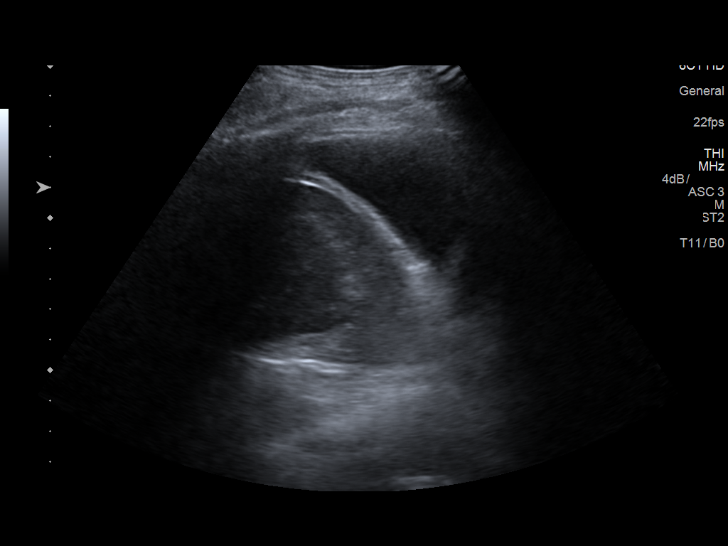
[im 10/13]
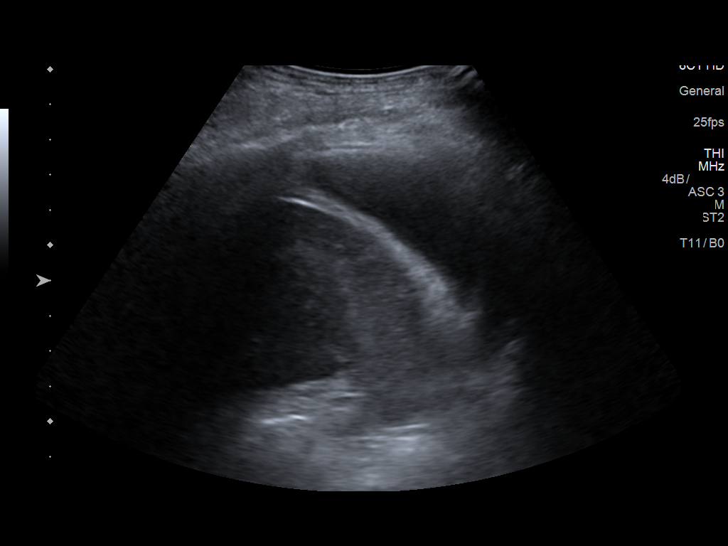
[im 11/13]
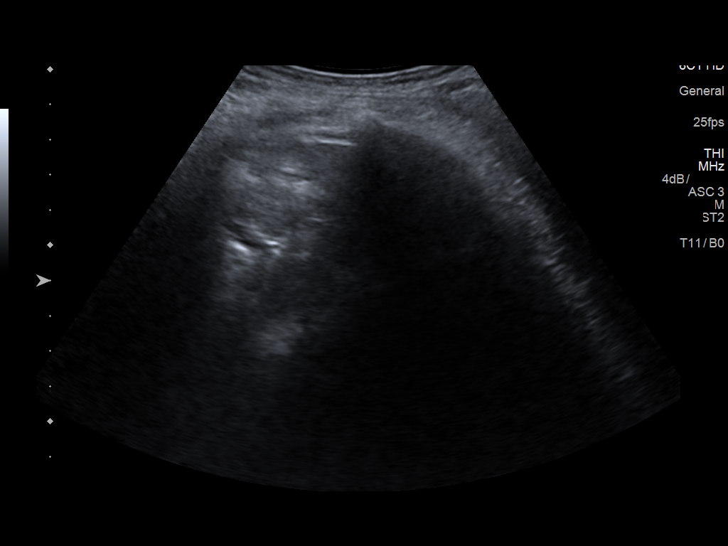
[im 12/13]
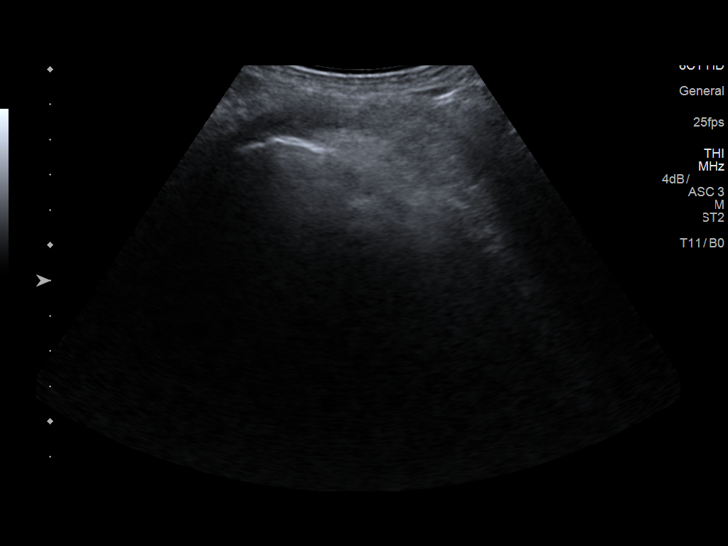
[im 13/13]
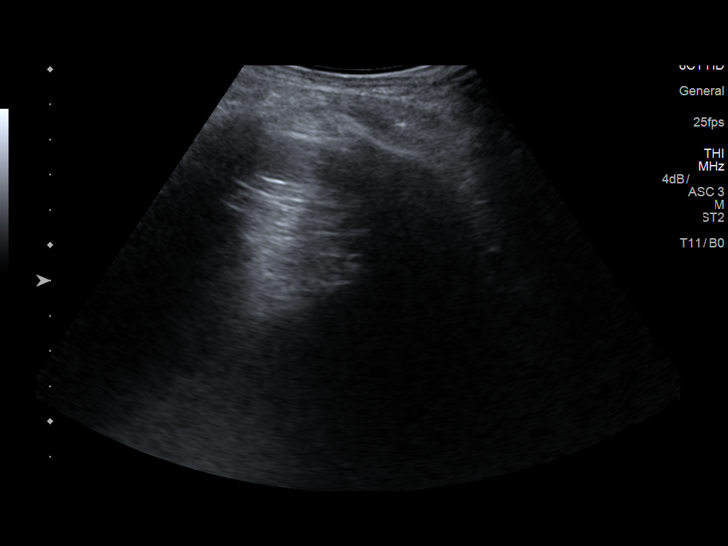

[13 of 13 positions shown; findings below may reference images not displayed]

FINDINGS: Small pleural effusion is noted on the left. Minimal pleural
effusion is noted on the right.
IMPRESSION: Small left pleural effusion.  Minimal right pleural effusion.

## 2021-01-14 DEATH — deceased
